# Patient Record
Sex: Female | Born: 1980
Health system: Southern US, Community
[De-identification: ages and names within clinical notes are randomized; demographics above are authoritative.]

## PROBLEM LIST (undated history)

## (undated) DIAGNOSIS — N809 Endometriosis, unspecified: Secondary | ICD-10-CM

## (undated) DIAGNOSIS — E079 Disorder of thyroid, unspecified: Secondary | ICD-10-CM

## (undated) DIAGNOSIS — F419 Anxiety disorder, unspecified: Secondary | ICD-10-CM

## (undated) DIAGNOSIS — K52839 Microscopic colitis, unspecified: Secondary | ICD-10-CM

## (undated) DIAGNOSIS — Z87442 Personal history of urinary calculi: Secondary | ICD-10-CM

## (undated) DIAGNOSIS — M3313 Other dermatomyositis without myopathy: Secondary | ICD-10-CM

## (undated) DIAGNOSIS — K52832 Lymphocytic colitis: Secondary | ICD-10-CM

## (undated) DIAGNOSIS — D649 Anemia, unspecified: Secondary | ICD-10-CM

## (undated) DIAGNOSIS — M3390 Dermatopolymyositis, unspecified, organ involvement unspecified: Secondary | ICD-10-CM

## (undated) DIAGNOSIS — E039 Hypothyroidism, unspecified: Secondary | ICD-10-CM

## (undated) HISTORY — DX: Other dermatomyositis without myopathy: M33.13

## (undated) HISTORY — DX: Endometriosis, unspecified: N80.9

## (undated) HISTORY — DX: Anxiety disorder, unspecified: F41.9

## (undated) HISTORY — PX: MUSCLE BIOPSY: SHX716

## (undated) HISTORY — DX: Microscopic colitis, unspecified: K52.839

## (undated) HISTORY — DX: Anemia, unspecified: D64.9

## (undated) HISTORY — DX: Disorder of thyroid, unspecified: E07.9

## (undated) HISTORY — DX: Dermatopolymyositis, unspecified, organ involvement unspecified: M33.90

## (undated) HISTORY — DX: Lymphocytic colitis: K52.832

## (undated) HISTORY — DX: Hypothyroidism, unspecified: E03.9

---
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
## 2001-02-20 ENCOUNTER — Emergency Department (HOSPITAL_COMMUNITY): Admission: EM | Admit: 2001-02-20 | Discharge: 2001-02-20 | Payer: Self-pay

## 2001-05-28 ENCOUNTER — Encounter: Payer: Self-pay | Admitting: *Deleted

## 2001-05-28 ENCOUNTER — Encounter: Admission: RE | Admit: 2001-05-28 | Discharge: 2001-05-28 | Payer: Self-pay | Admitting: *Deleted

## 2001-06-04 ENCOUNTER — Other Ambulatory Visit: Admission: RE | Admit: 2001-06-04 | Discharge: 2001-06-04 | Payer: Self-pay | Admitting: *Deleted

## 2002-06-15 ENCOUNTER — Other Ambulatory Visit: Admission: RE | Admit: 2002-06-15 | Discharge: 2002-06-15 | Payer: Self-pay | Admitting: *Deleted

## 2003-03-22 ENCOUNTER — Emergency Department (HOSPITAL_COMMUNITY): Admission: EM | Admit: 2003-03-22 | Discharge: 2003-03-22 | Payer: Self-pay | Admitting: *Deleted

## 2003-06-16 ENCOUNTER — Other Ambulatory Visit: Admission: RE | Admit: 2003-06-16 | Discharge: 2003-06-16 | Payer: Self-pay | Admitting: *Deleted

## 2003-12-26 ENCOUNTER — Ambulatory Visit: Payer: Self-pay | Admitting: Family Medicine

## 2003-12-28 ENCOUNTER — Ambulatory Visit (HOSPITAL_COMMUNITY): Admission: RE | Admit: 2003-12-28 | Discharge: 2003-12-28 | Payer: Self-pay | Admitting: *Deleted

## 2004-01-25 ENCOUNTER — Ambulatory Visit: Payer: Self-pay | Admitting: Family Medicine

## 2004-06-18 ENCOUNTER — Other Ambulatory Visit: Admission: RE | Admit: 2004-06-18 | Discharge: 2004-06-18 | Payer: Self-pay | Admitting: *Deleted

## 2004-10-31 ENCOUNTER — Encounter (INDEPENDENT_AMBULATORY_CARE_PROVIDER_SITE_OTHER): Payer: Self-pay | Admitting: *Deleted

## 2004-10-31 ENCOUNTER — Ambulatory Visit (HOSPITAL_BASED_OUTPATIENT_CLINIC_OR_DEPARTMENT_OTHER): Admission: RE | Admit: 2004-10-31 | Discharge: 2004-10-31 | Payer: Self-pay | Admitting: Plastic Surgery

## 2004-12-11 ENCOUNTER — Ambulatory Visit: Payer: Self-pay | Admitting: Family Medicine

## 2005-01-14 ENCOUNTER — Emergency Department (HOSPITAL_COMMUNITY): Admission: EM | Admit: 2005-01-14 | Discharge: 2005-01-14 | Payer: Self-pay | Admitting: *Deleted

## 2005-07-02 ENCOUNTER — Other Ambulatory Visit: Admission: RE | Admit: 2005-07-02 | Discharge: 2005-07-02 | Payer: Self-pay | Admitting: *Deleted

## 2006-06-23 ENCOUNTER — Other Ambulatory Visit: Admission: RE | Admit: 2006-06-23 | Discharge: 2006-06-23 | Payer: Self-pay | Admitting: *Deleted

## 2006-06-25 ENCOUNTER — Encounter: Admission: RE | Admit: 2006-06-25 | Discharge: 2006-06-25 | Payer: Self-pay | Admitting: *Deleted

## 2006-07-22 ENCOUNTER — Ambulatory Visit (HOSPITAL_COMMUNITY): Admission: RE | Admit: 2006-07-22 | Discharge: 2006-07-22 | Payer: Self-pay | Admitting: *Deleted

## 2007-09-11 ENCOUNTER — Encounter: Admission: RE | Admit: 2007-09-11 | Discharge: 2007-09-11 | Payer: Self-pay | Admitting: Obstetrics and Gynecology

## 2008-02-06 ENCOUNTER — Emergency Department (HOSPITAL_COMMUNITY): Admission: EM | Admit: 2008-02-06 | Discharge: 2008-02-06 | Payer: Self-pay | Admitting: Family Medicine

## 2009-02-04 HISTORY — PX: OTHER SURGICAL HISTORY: SHX169

## 2009-03-03 ENCOUNTER — Inpatient Hospital Stay (HOSPITAL_COMMUNITY): Admission: AD | Admit: 2009-03-03 | Discharge: 2009-03-04 | Payer: Self-pay | Admitting: Obstetrics & Gynecology

## 2009-03-04 ENCOUNTER — Inpatient Hospital Stay (HOSPITAL_COMMUNITY): Admission: AD | Admit: 2009-03-04 | Discharge: 2009-03-04 | Payer: Self-pay | Admitting: Obstetrics & Gynecology

## 2009-03-17 ENCOUNTER — Inpatient Hospital Stay (HOSPITAL_COMMUNITY): Admission: AD | Admit: 2009-03-17 | Discharge: 2009-03-17 | Payer: Self-pay | Admitting: Obstetrics & Gynecology

## 2009-05-17 ENCOUNTER — Inpatient Hospital Stay (HOSPITAL_COMMUNITY): Admission: AD | Admit: 2009-05-17 | Discharge: 2009-05-17 | Payer: Self-pay | Admitting: Obstetrics and Gynecology

## 2009-05-18 ENCOUNTER — Inpatient Hospital Stay (HOSPITAL_COMMUNITY): Admission: AD | Admit: 2009-05-18 | Discharge: 2009-06-28 | Payer: Self-pay | Admitting: Obstetrics and Gynecology

## 2009-05-31 ENCOUNTER — Encounter: Payer: Self-pay | Admitting: Obstetrics and Gynecology

## 2009-06-09 ENCOUNTER — Encounter: Payer: Self-pay | Admitting: Obstetrics and Gynecology

## 2009-06-15 ENCOUNTER — Encounter: Payer: Self-pay | Admitting: Obstetrics and Gynecology

## 2009-06-22 ENCOUNTER — Encounter: Payer: Self-pay | Admitting: Obstetrics and Gynecology

## 2010-02-25 ENCOUNTER — Encounter: Payer: Self-pay | Admitting: Obstetrics and Gynecology

## 2010-04-23 LAB — CBC
HCT: 28 % — ABNORMAL LOW (ref 36.0–46.0)
Hemoglobin: 10.2 g/dL — ABNORMAL LOW (ref 12.0–15.0)
Hemoglobin: 10 g/dL — ABNORMAL LOW (ref 12.0–15.0)
MCHC: 35.2 g/dL (ref 30.0–36.0)
MCHC: 35.6 g/dL (ref 30.0–36.0)
MCV: 97.3 fL (ref 78.0–100.0)
MCV: 98.7 fL (ref 78.0–100.0)
Platelets: 172 10*3/uL (ref 150–400)
Platelets: 158 10*3/uL (ref 150–400)
RDW: 12.2 % (ref 11.5–15.5)
WBC: 9.5 10*3/uL (ref 4.0–10.5)
WBC: 11.1 10*3/uL — ABNORMAL HIGH (ref 4.0–10.5)
WBC: 13.4 10*3/uL — ABNORMAL HIGH (ref 4.0–10.5)

## 2010-04-23 LAB — ABO/RH: ABO/RH(D): O POS

## 2010-04-24 LAB — CBC
HCT: 27.9 % — ABNORMAL LOW (ref 36.0–46.0)
MCV: 99.7 fL (ref 78.0–100.0)
RDW: 11.9 % (ref 11.5–15.5)
WBC: 17.2 10*3/uL — ABNORMAL HIGH (ref 4.0–10.5)

## 2010-04-24 LAB — GLUCOSE TOLERANCE, 1 HOUR: Glucose, 1 Hour GTT: 96 mg/dL (ref 70–140)

## 2010-04-24 LAB — URINE CULTURE: Special Requests: NEGATIVE

## 2010-04-24 LAB — TSH: TSH: 1.452 u[IU]/mL (ref 0.350–4.500)

## 2010-04-24 LAB — STREP B DNA PROBE

## 2010-04-24 LAB — MAGNESIUM: Magnesium: 6.3 mg/dL (ref 1.5–2.5)

## 2010-04-25 LAB — URINALYSIS, ROUTINE W REFLEX MICROSCOPIC
Bilirubin Urine: NEGATIVE
Glucose, UA: NEGATIVE mg/dL
Hgb urine dipstick: NEGATIVE
Ketones, ur: NEGATIVE mg/dL
pH: 7 (ref 5.0–8.0)

## 2010-04-26 LAB — URINALYSIS, ROUTINE W REFLEX MICROSCOPIC
Bilirubin Urine: NEGATIVE
Nitrite: NEGATIVE
Specific Gravity, Urine: 1.025 (ref 1.005–1.030)
pH: 7 (ref 5.0–8.0)

## 2010-04-26 LAB — URINE MICROSCOPIC-ADD ON

## 2010-04-26 LAB — CBC
Hemoglobin: 10.6 g/dL — ABNORMAL LOW (ref 12.0–15.0)
Platelets: 172 10*3/uL (ref 150–400)
RDW: 12.4 % (ref 11.5–15.5)

## 2010-05-21 LAB — POCT URINALYSIS DIP (DEVICE)
Glucose, UA: NEGATIVE mg/dL
Nitrite: NEGATIVE

## 2010-06-04 ENCOUNTER — Other Ambulatory Visit (INDEPENDENT_AMBULATORY_CARE_PROVIDER_SITE_OTHER): Payer: Self-pay | Admitting: *Deleted

## 2010-06-04 DIAGNOSIS — E039 Hypothyroidism, unspecified: Secondary | ICD-10-CM

## 2010-06-04 LAB — TSH: TSH: 5.07 u[IU]/mL (ref 0.35–5.50)

## 2011-01-07 IMAGING — US US OB COMP +14 WK
1 series · 14 of 28 positions shown · non-contrast
Comparison: none

OBSTETRICAL ULTRASOUND:
 This ultrasound was performed in The [HOSPITAL], and the [facility] report will be stored to [REDACTED] PACS.  This report is also available in [HOSPITAL]?s accessANYware.

[Series 1: us ob comp +14 wk · 0.21mm/px · 14 of 82 slices shown]
[im 4/82]
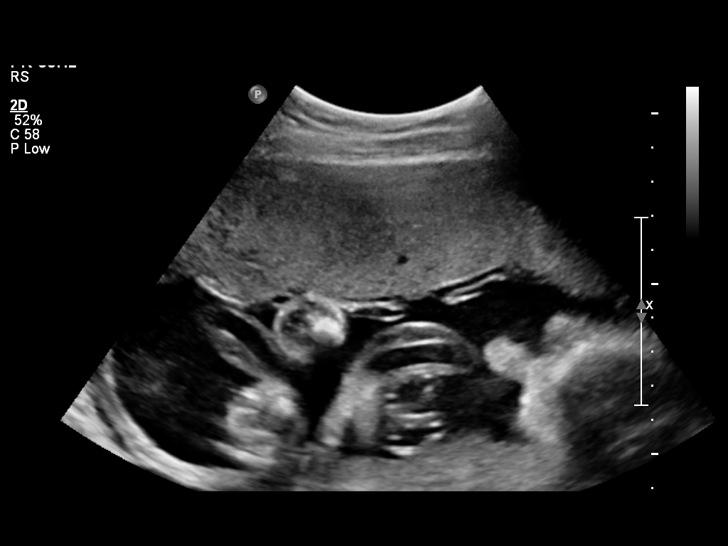
[im 10/82]
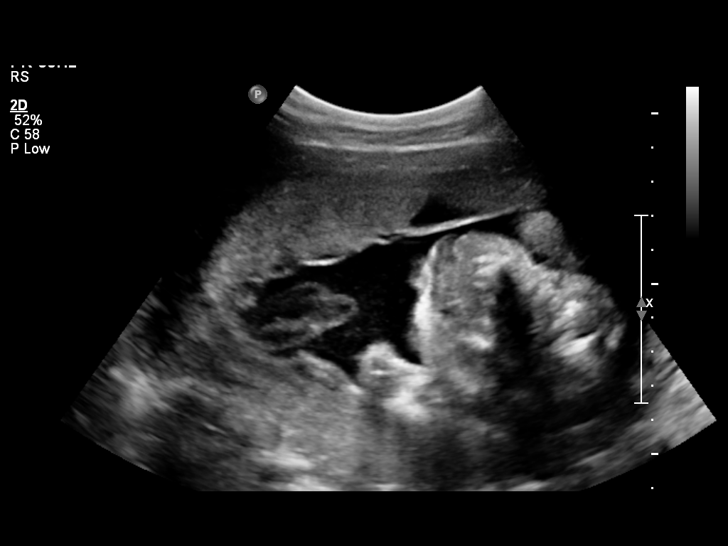
[im 16/82]
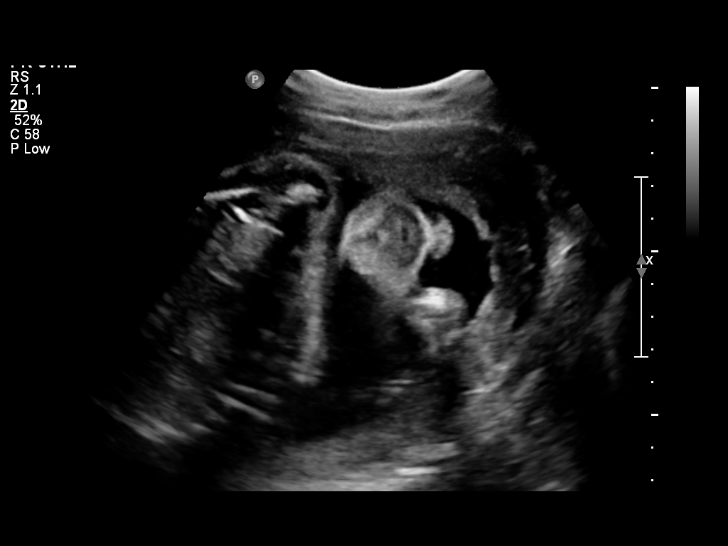
[im 22/82]
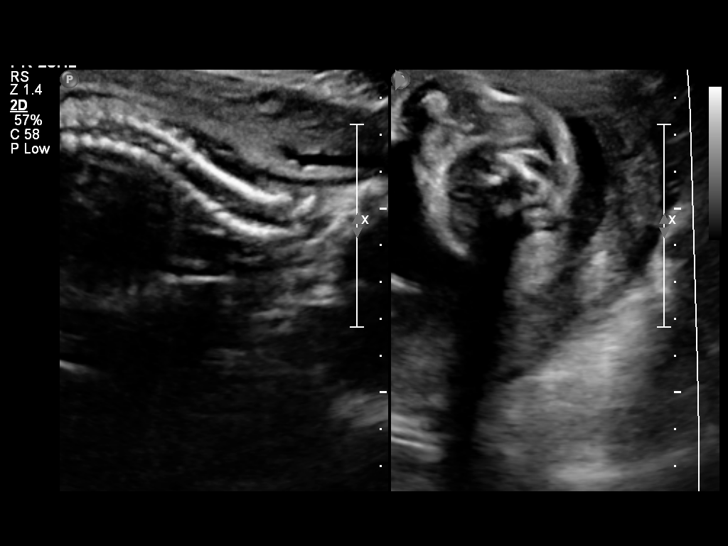
[im 28/82]
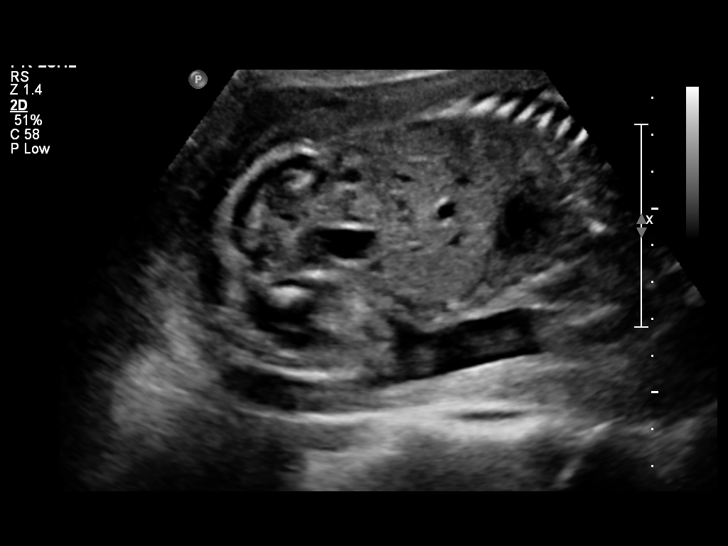
[im 34/82]
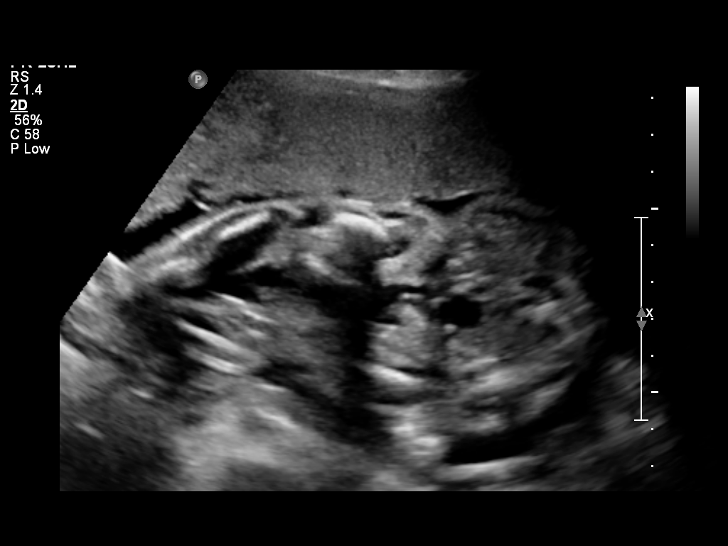
[im 40/82]
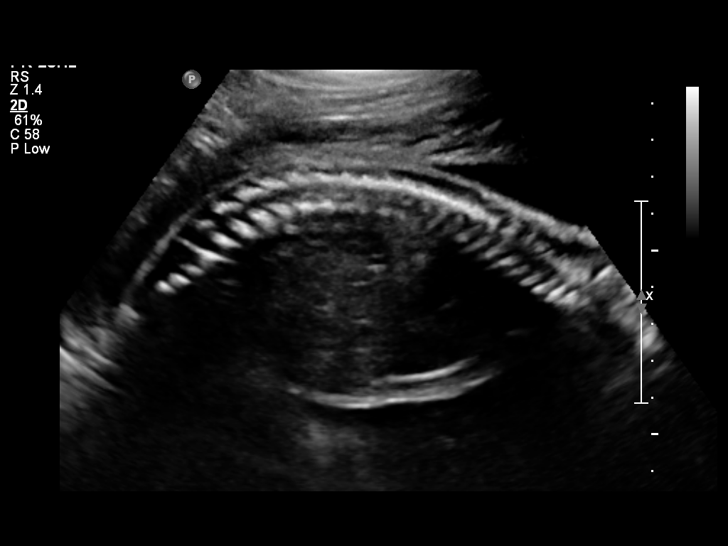
[im 46/82]
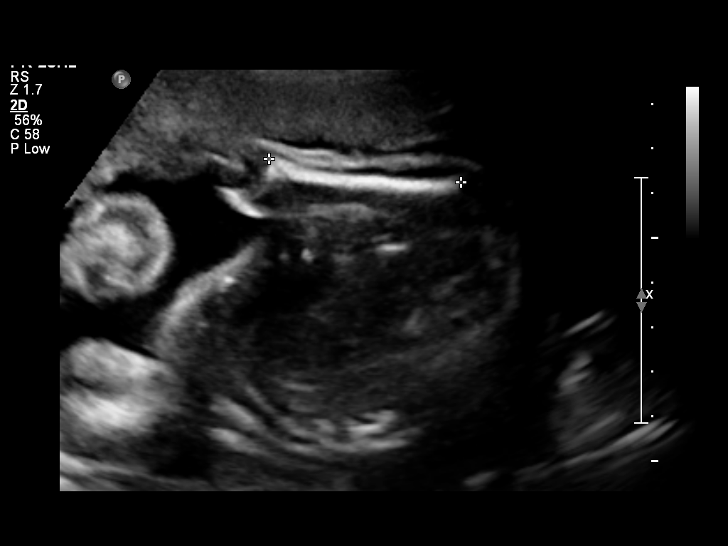
[im 52/82]
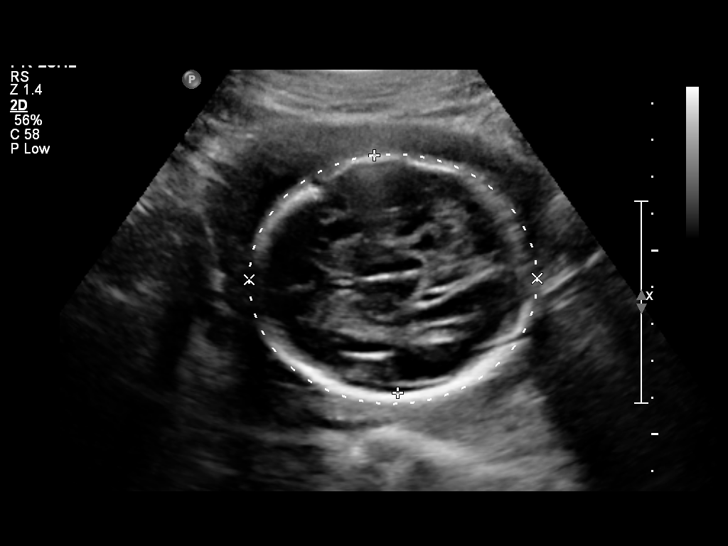
[im 58/82]
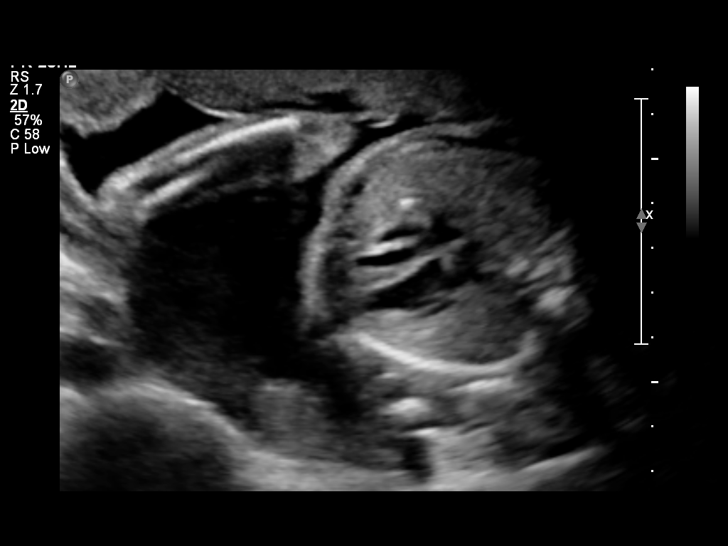
[im 64/82]
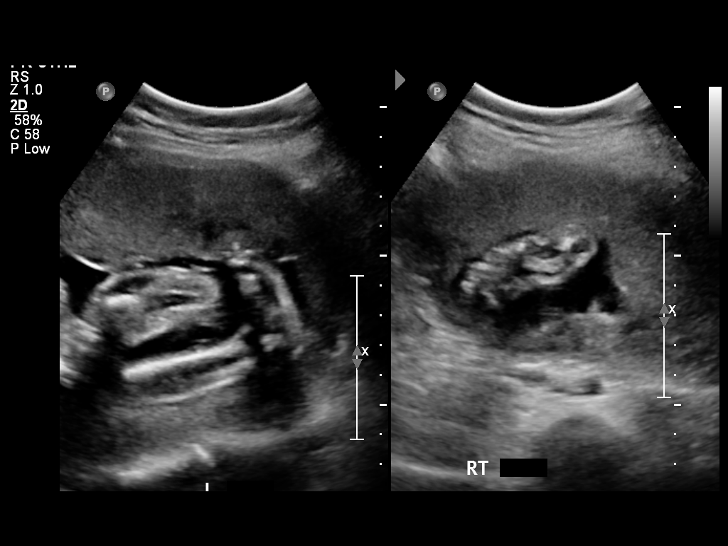
[im 70/82]
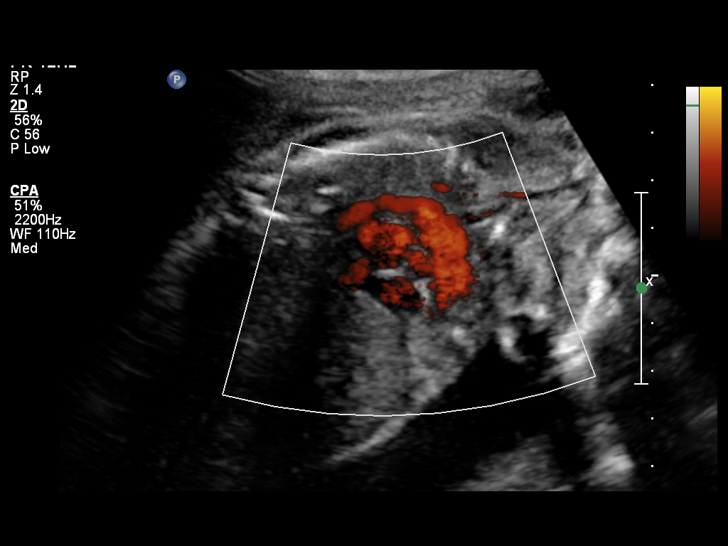
[im 76/82]
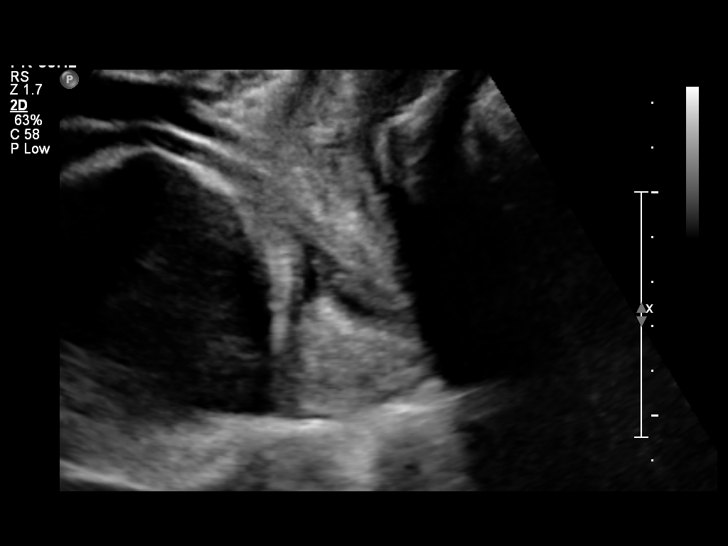
[im 82/82]
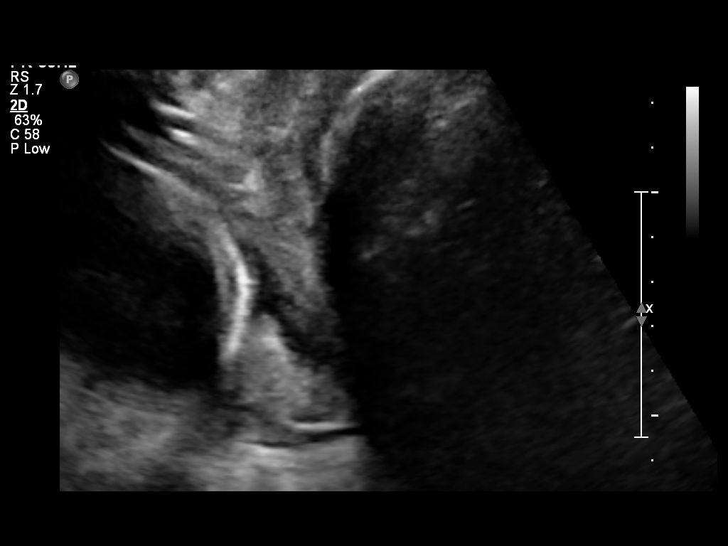

[14 of 28 positions shown; findings below may reference images not displayed]

IMPRESSION: [facility] has also been faxed to the ordering physician.

## 2011-01-08 ENCOUNTER — Other Ambulatory Visit: Payer: Self-pay | Admitting: Internal Medicine

## 2011-01-08 DIAGNOSIS — E041 Nontoxic single thyroid nodule: Secondary | ICD-10-CM

## 2011-01-10 ENCOUNTER — Other Ambulatory Visit: Payer: Self-pay | Admitting: Internal Medicine

## 2011-01-10 ENCOUNTER — Ambulatory Visit
Admission: RE | Admit: 2011-01-10 | Discharge: 2011-01-10 | Disposition: A | Discharge status: Home / Self Care | Payer: 59 | Source: Ambulatory Visit | Attending: Internal Medicine | Admitting: Internal Medicine

## 2011-01-10 DIAGNOSIS — E041 Nontoxic single thyroid nodule: Secondary | ICD-10-CM

## 2011-01-29 IMAGING — US US OB TRANSVAGINAL MODIFY
1 series · 18 of 28 positions shown · non-contrast
Comparison: none

OBSTETRICAL ULTRASOUND:
 This ultrasound was performed in The [HOSPITAL], and the [facility] report will be stored to [REDACTED] PACS.  This report is also available in [HOSPITAL]?s accessANYware.

[Series 1: us ob transvaginal modify · 18 of 30 slices shown]
[im 1/30]
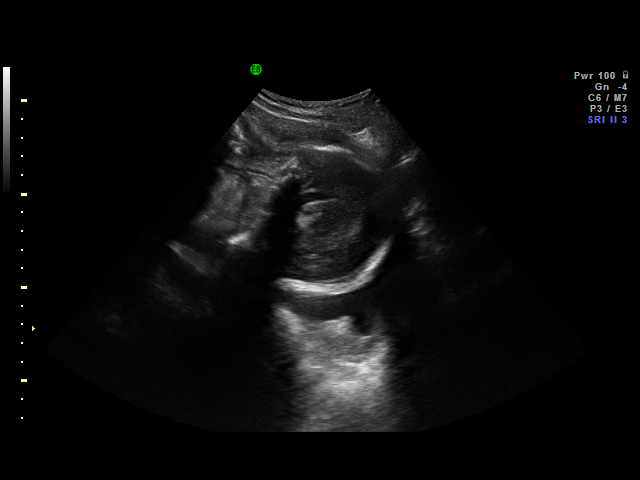
[im 3/30]
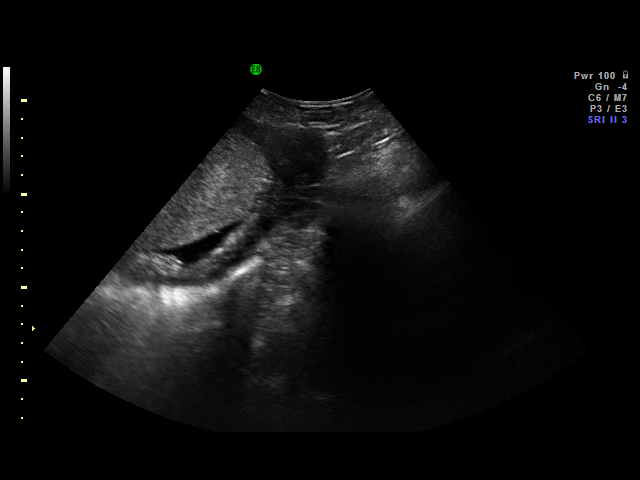
[im 4/30]
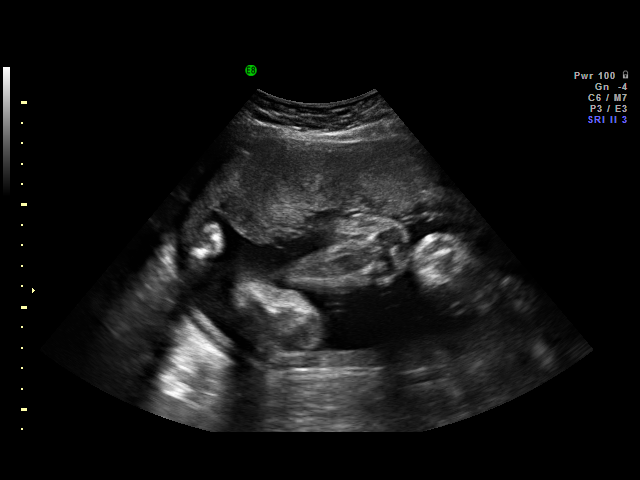
[im 6/30]
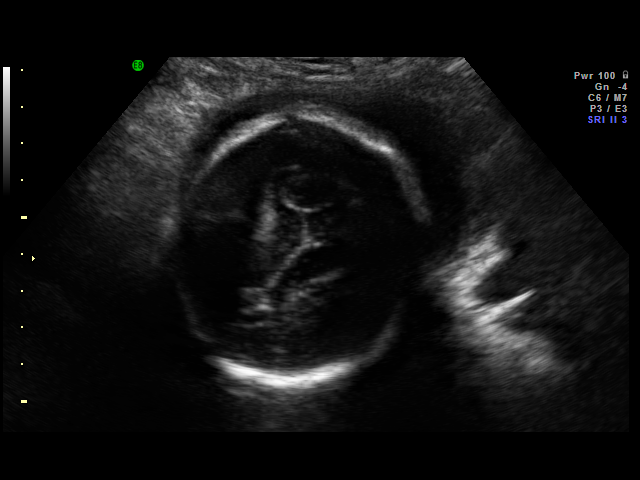
[im 8/30]
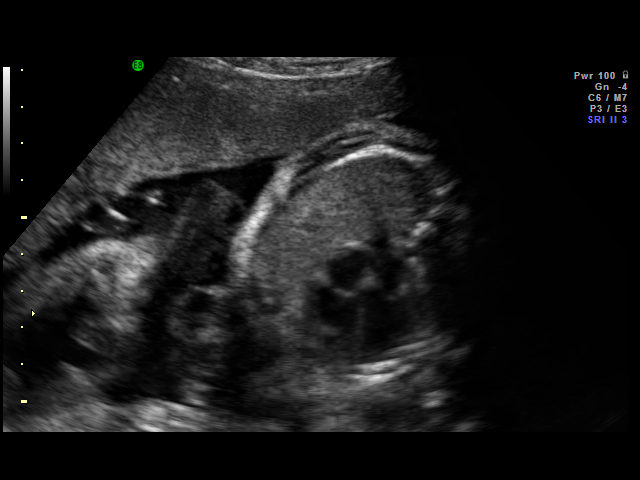
[im 9/30]
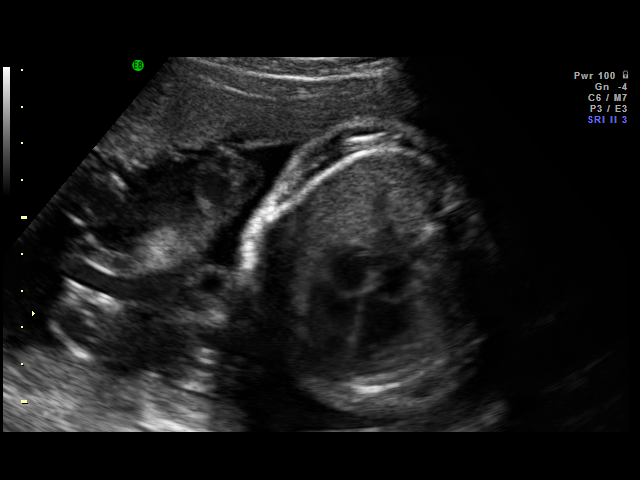
[im 11/30]
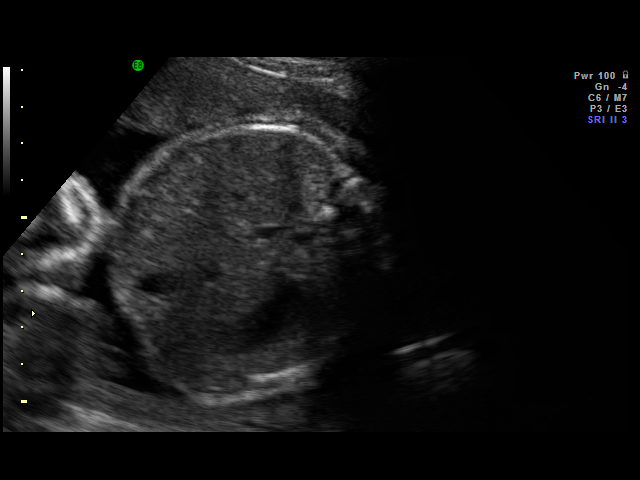
[im 12/30]
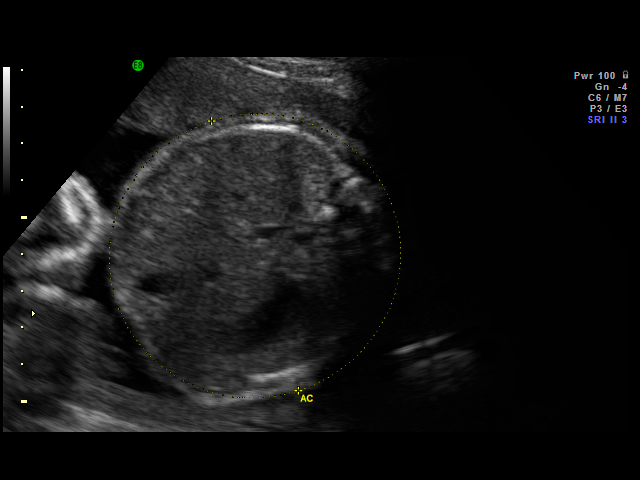
[im 14/30]
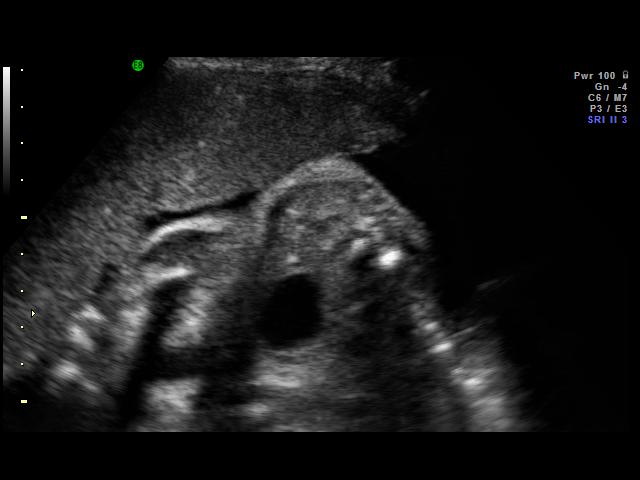
[im 16/30]
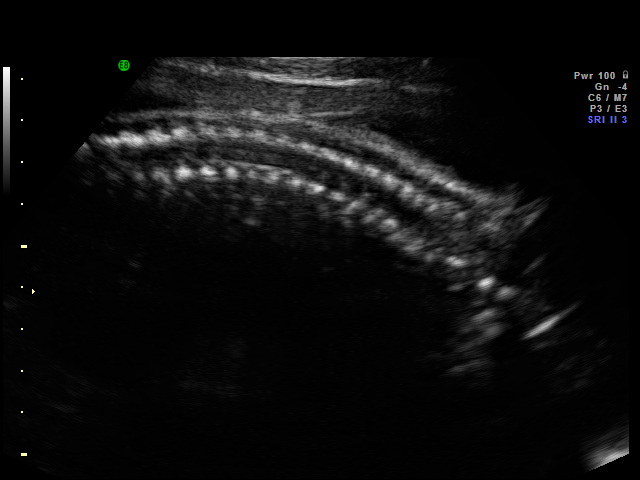
[im 18/30]
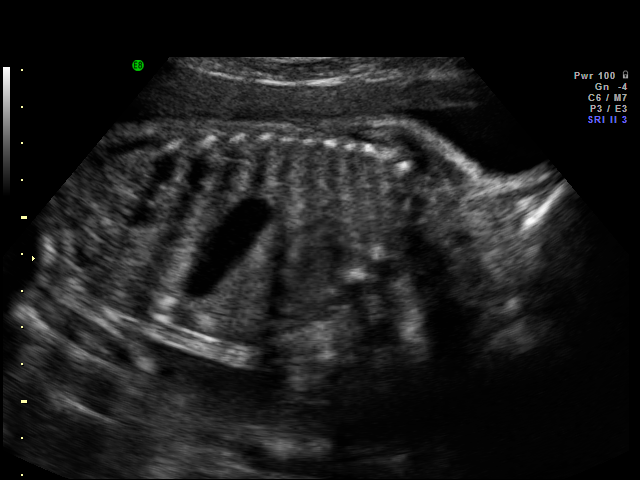
[im 19/30]
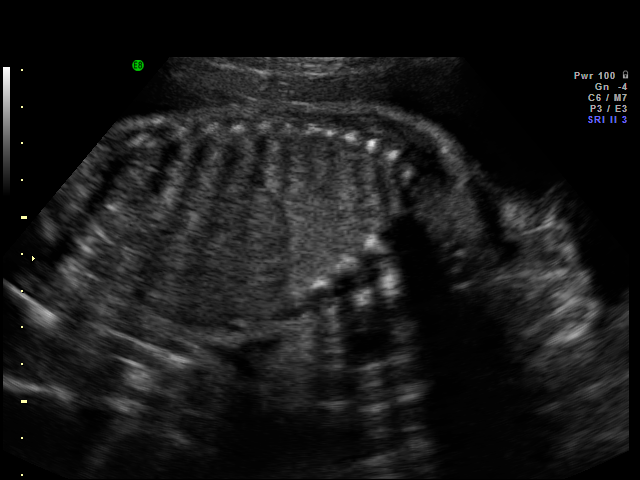
[im 21/30]
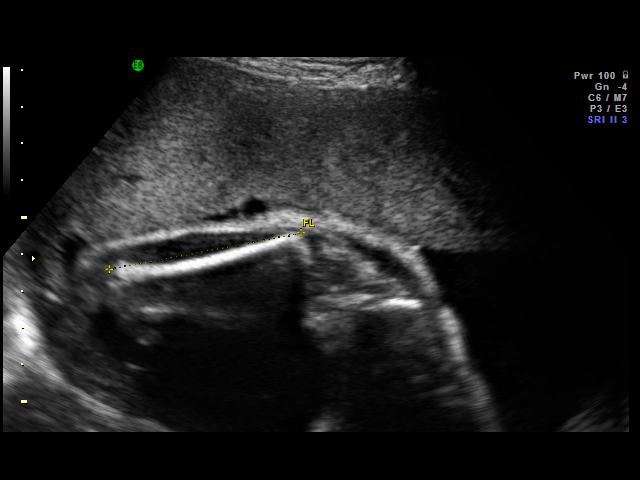
[im 23/30]
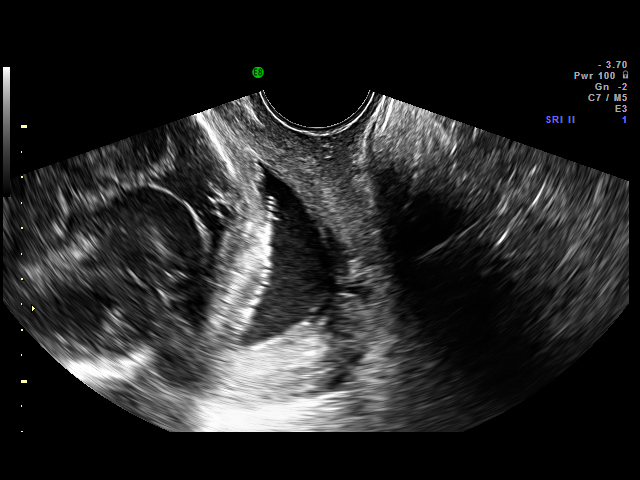
[im 24/30]
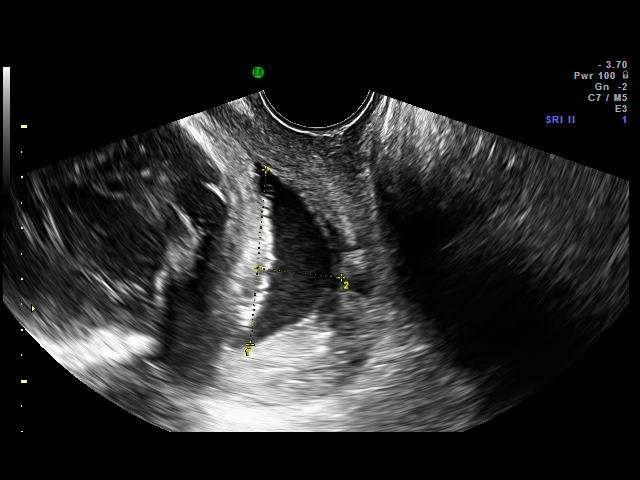
[im 26/30]
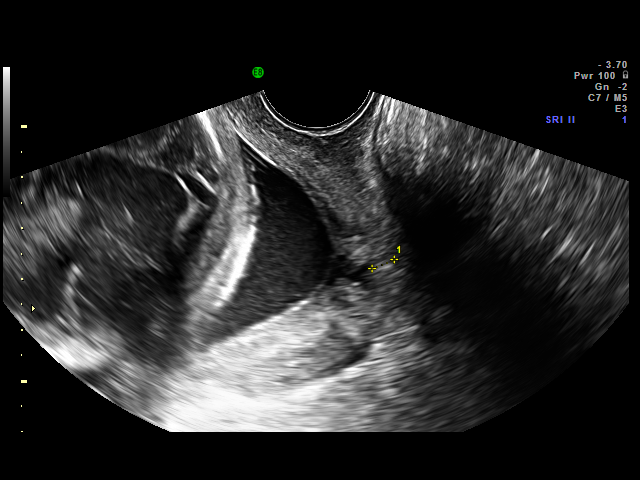
[im 27/30]
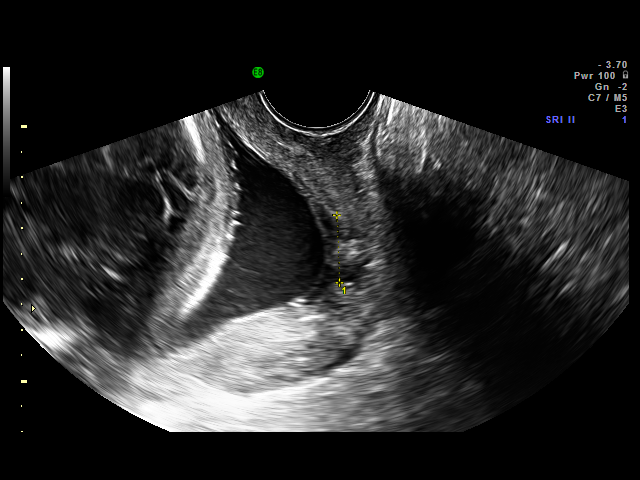
[im 30/30]
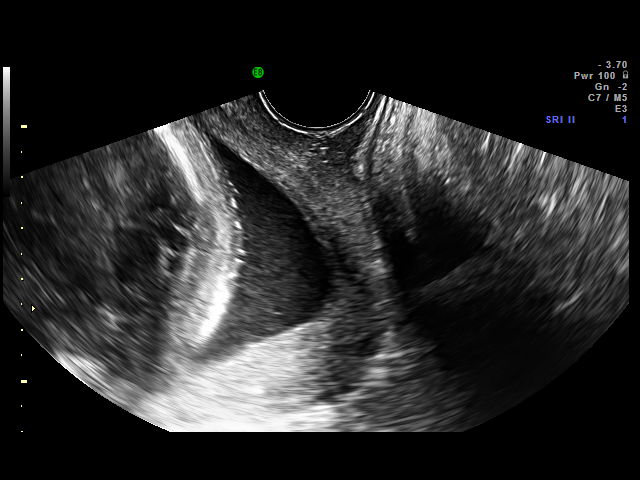

[18 of 28 positions shown; findings below may reference images not displayed]

IMPRESSION: [facility] has also been faxed to the ordering physician.

## 2011-07-17 ENCOUNTER — Encounter: Payer: Self-pay | Admitting: *Deleted

## 2012-11-02 ENCOUNTER — Encounter: Payer: Self-pay | Admitting: Gastroenterology

## 2012-12-02 ENCOUNTER — Other Ambulatory Visit (INDEPENDENT_AMBULATORY_CARE_PROVIDER_SITE_OTHER): Payer: 59

## 2012-12-02 ENCOUNTER — Ambulatory Visit (INDEPENDENT_AMBULATORY_CARE_PROVIDER_SITE_OTHER): Payer: 59 | Admitting: Gastroenterology

## 2012-12-02 ENCOUNTER — Encounter: Payer: Self-pay | Admitting: Gastroenterology

## 2012-12-02 VITALS — BP 100/60 | HR 72 | Ht 65.0 in | Wt 144.2 lb

## 2012-12-02 DIAGNOSIS — R197 Diarrhea, unspecified: Secondary | ICD-10-CM

## 2012-12-02 DIAGNOSIS — Z8639 Personal history of other endocrine, nutritional and metabolic disease: Secondary | ICD-10-CM | POA: Insufficient documentation

## 2012-12-02 LAB — TSH: TSH: 2.63 u[IU]/mL (ref 0.35–5.50)

## 2012-12-02 NOTE — Patient Instructions (Signed)
You will have labs checked today in the basement lab.  Please head down after you check out with the front desk  (TSH, c. Diff by PCR, stool for ova/parasites, stool for fecal leuks, stool for routine culture, cmet, celiac panel, esr). Pending those results you may need colonoscopy.

## 2012-12-02 NOTE — Progress Notes (Signed)
HPI: This is a  very pleasant 32 year old woman whom I am meeting for the first time today.  Colonoscopy 15 years ago, [facility].  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  Diarrhea for 3 months, 6-7 times per day.  No nocturnal.  Post prandial. No bleeding. Epigastric discomfort for 2 months.  Had colitis in her teens.  [facility].  Was put on imodium, no specific IBD type treatment.  Takes imodium 2 pills per day, works pretty well.  Overall weight stable.  TSH checked 2 months ago.  She does not drink a lot of caffeine, she does not drink much alcohol, she is not into diet beverages or sugar substitutes or sports drinks.  Had hashimotos.  Did brat diet for a while, no changes.  No preceeding antibiotics.   Review of systems: Pertinent positive and negative review of systems were noted in the above HPI section. Complete review of systems was performed and was otherwise normal.    Past Medical History  Diagnosis Date  . Anemia   . Thyroid disease     Past Surgical History  Procedure Laterality Date  . Laproscopy for endometrosis      Current Outpatient Prescriptions  Medication Sig Dispense Refill  . levothyroxine (SYNTHROID, LEVOTHROID) 75 MCG tablet Take 75 mcg by mouth daily before breakfast.      . sertraline (ZOLOFT) 100 MG tablet Take 100 mg by mouth daily.       No current facility-administered medications for this visit.    Allergies as of 12/02/2012 - Review Complete 12/02/2012  Allergen Reaction Noted  . Ciprocinonide [fluocinolone] Rash 12/02/2012  . Penicillins Rash 12/02/2012    Family History  Problem Relation Age of Onset  . Hypertension Father   . Hyperlipidemia Father     History   Social History  . Marital Status: Married    Spouse Name: N/A    Number of Children: 1  . Years of Education: N/A   Occupational History  . RN [employer]   Social History Main Topics  . Smoking status: Never Smoker   . Smokeless tobacco: Never Used  . Alcohol Use: No   . Drug Use: No  . Sexual Activity: Not on file   Other Topics Concern  . Not on file   Social History Narrative  . No narrative on file       Physical Exam: BP 100/60  Pulse 72  Ht 5\' 5"  (1.651 m)  Wt 144 lb 4 oz (65.431 kg)  BMI 24 kg/m2  LMP 11/28/2012 Constitutional: generally well-appearing Psychiatric: alert and oriented x3 Eyes: extraocular movements intact Mouth: oral pharynx moist, no lesions Neck: supple no lymphadenopathy Cardiovascular: heart regular rate and rhythm Lungs: clear to auscultation bilaterally Abdomen: soft, nontender, nondistended, no obvious ascites, no peritoneal signs, normal bowel sounds Extremities: no lower extremity edema bilaterally Skin: no lesions on visible extremities    Assessment and plan: 32 y.o. female with  nonbloody diarrhea, mild abdominal discomfort for 3 months.  She has a history of colitis 15 years ago. She is not exactly clear what type of colitis it was. Perhaps infectious. She does not recall being on any specific IBD-type medicines. Going to have testing for celiac sprue, infectious process with stool testing as well, sedimentation rate, recheck her thyroid level. Pending those results she may need colonoscopy as well. In the meantime Imodium helps her and she'll stay on that as needed.

## 2012-12-03 ENCOUNTER — Other Ambulatory Visit: Payer: 59

## 2012-12-03 DIAGNOSIS — R197 Diarrhea, unspecified: Secondary | ICD-10-CM

## 2012-12-03 LAB — CELIAC PANEL 10
Endomysial Screen: NEGATIVE
Gliadin IgA: 3.1 U/mL (ref <20)
Gliadin IgG: 4.4 U/mL (ref <20)
IgA: 93 mg/dL (ref 69–380)
Tissue Transglut Ab: 4.2 U/mL (ref <20)
Tissue Transglutaminase Ab, IgA: 2.5 U/mL (ref <20)

## 2012-12-04 ENCOUNTER — Other Ambulatory Visit: Payer: Self-pay | Admitting: Gastroenterology

## 2012-12-04 LAB — OVA AND PARASITE SCREEN: OP: NONE SEEN

## 2012-12-04 LAB — CLOSTRIDIUM DIFFICILE BY PCR: Toxigenic C. Difficile by PCR: NOT DETECTED

## 2012-12-05 LAB — FECAL LACTOFERRIN, QUANT: Lactoferrin: NEGATIVE

## 2012-12-07 LAB — STOOL CULTURE

## 2012-12-10 ENCOUNTER — Ambulatory Visit (AMBULATORY_SURGERY_CENTER): Payer: Self-pay | Admitting: *Deleted

## 2012-12-10 VITALS — Ht 65.0 in | Wt 145.4 lb

## 2012-12-10 DIAGNOSIS — R197 Diarrhea, unspecified: Secondary | ICD-10-CM

## 2012-12-10 MED ORDER — MOVIPREP 100 G PO SOLR: ORAL | Status: DC

## 2012-12-10 NOTE — Progress Notes (Signed)
No allergies to eggs or soy. No problems with anesthesia.  

## 2012-12-11 ENCOUNTER — Encounter: Payer: Self-pay | Admitting: Gastroenterology

## 2012-12-14 ENCOUNTER — Encounter: Payer: Self-pay | Admitting: Gastroenterology

## 2012-12-14 ENCOUNTER — Ambulatory Visit (AMBULATORY_SURGERY_CENTER): Payer: 59 | Admitting: Gastroenterology

## 2012-12-14 VITALS — BP 105/66 | HR 60 | Temp 98.4°F | Resp 17 | Ht 65.0 in | Wt 145.0 lb

## 2012-12-14 DIAGNOSIS — K5289 Other specified noninfective gastroenteritis and colitis: Secondary | ICD-10-CM

## 2012-12-14 DIAGNOSIS — R197 Diarrhea, unspecified: Secondary | ICD-10-CM

## 2012-12-14 MED ORDER — SODIUM CHLORIDE 0.9 % IV SOLN: 500.0000 mL | INTRAVENOUS | Status: DC

## 2012-12-14 NOTE — Progress Notes (Signed)
Patient did not experience any of the following events: a burn prior to discharge; a fall within the facility; wrong site/side/patient/procedure/implant event; or a hospital transfer or hospital admission upon discharge from the facility. (G8907) Patient did not have preoperative order for IV antibiotic SSI prophylaxis. (G8918)  

## 2012-12-14 NOTE — Progress Notes (Signed)
The pt was nervous pre-procedure.  Sedation titrated.  Pt was drowsy but opened her eyes off and on through the exam.  She was comfortable after the cecum was reached and while the scope was advance.  Pt tolerated the colonoscopy very well. Maw

## 2012-12-14 NOTE — Patient Instructions (Signed)
Discharge instructions given with verbal understanding. Biopsies taken. Resume previous medications,. YOU HAD AN ENDOSCOPIC PROCEDURE TODAY AT THE [facility]: Refer to the procedure report that was given to you for any specific questions about what was found during the examination.  If the procedure report does not answer your questions, please call your gastroenterologist to clarify.  If you requested that your care partner not be given the details of your procedure findings, then the procedure report has been included in a sealed envelope for you to review at your convenience later.  YOU SHOULD EXPECT: Some feelings of bloating in the abdomen. Passage of more gas than usual.  Walking can help get rid of the air that was put into your GI tract during the procedure and reduce the bloating. If you had a lower endoscopy (such as a colonoscopy or flexible sigmoidoscopy) you may notice spotting of blood in your stool or on the toilet paper. If you underwent a bowel prep for your procedure, then you may not have a normal bowel movement for a few days.  DIET: Your first meal following the procedure should be a light meal and then it is ok to progress to your normal diet.  A half-sandwich or bowl of soup is an example of a good first meal.  Heavy or fried foods are harder to digest and may make you feel nauseous or bloated.  Likewise meals heavy in dairy and vegetables can cause extra gas to form and this can also increase the bloating.  Drink plenty of fluids but you should avoid alcoholic beverages for 24 hours.  ACTIVITY: Your care partner should take you home directly after the procedure.  You should plan to take it easy, moving slowly for the rest of the day.  You can resume normal activity the day after the procedure however you should NOT DRIVE or use heavy machinery for 24 hours (because of the sedation medicines used during the test).    SYMPTOMS TO REPORT IMMEDIATELY: A  gastroenterologist can be reached at any hour.  During normal business hours, 8:30 AM to 5:00 PM Monday through Friday, call [phone].  After hours and on weekends, please call the GI answering service at [phone] who will take a message and have the physician on call contact you.   Following lower endoscopy (colonoscopy or flexible sigmoidoscopy):  Excessive amounts of blood in the stool  Significant tenderness or worsening of abdominal pains  Swelling of the abdomen that is new, acute  Fever of 100F or higher  FOLLOW UP: If any biopsies were taken you will be contacted by phone or by letter within the next 1-3 weeks.  Call your gastroenterologist if you have not heard about the biopsies in 3 weeks.  Our staff will call the home number listed on your records the next business day following your procedure to check on you and address any questions or concerns that you may have at that time regarding the information given to you following your procedure. This is a courtesy call and so if there is no answer at the home number and we have not heard from you through the emergency physician on call, we will assume that you have returned to your regular daily activities without incident.  SIGNATURES/CONFIDENTIALITY: You and/or your care partner have signed paperwork which will be entered into your electronic medical record.  These signatures attest to the fact that that the information above on your After Visit Summary has been reviewed  and is understood.  Full responsibility of the confidentiality of this discharge information lies with you and/or your care-partner. 

## 2012-12-14 NOTE — Op Note (Signed)
[facility] [address] [place], [address]   COLONOSCOPY PROCEDURE REPORT  PATIENT: Annette Harris, Annette Harris  MR#: [id number] BIRTHDATE: 08-Aug-1980 , 32  yrs. old GENDER: Female ENDOSCOPIST: Rachael Fee, MD REFERRED AO:[id number] Cyndie Mull, M.D. PROCEDURE DATE:  12/14/2012 PROCEDURE:   Colonoscopy with hot biopsy/bipolar First Screening Colonoscopy - Avg.  risk and is 50 yrs.  old or older - No.  Prior Negative Screening - Now for repeat screening. N/A  History of Adenoma - Now for follow-up colonoscopy & has been > or = to 3 yrs.  N/A  Polyps Removed Today? No.  Recommend repeat exam, <10 yrs? No. ASA CLASS:   Class I INDICATIONS:chronic diarrhea. MEDICATIONS: Fentanyl 75 mcg IV, Versed 6 mg IV, and These medications were titrated to patient response per physician's verbal order  DESCRIPTION OF PROCEDURE:   After the risks benefits and alternatives of the procedure were thoroughly explained, informed consent was obtained.  A digital rectal exam revealed no abnormalities of the rectum.   The LB [id number]  endoscope was introduced through the anus and advanced to the terminal ileum which was intubated for a short distance. No adverse events experienced.   The quality of the prep was excellent.  The instrument was then slowly withdrawn as the colon was fully examined.   COLON FINDINGS: The terminal ileum was normal.  The colonic mucosa was normal throughout and was randomly biopsied, sent to pathology. Retroflexed views revealed no abnormalities. The time to cecum=1 minutes 58 seconds.  Withdrawal time=5 minutes 34 seconds.  The scope was withdrawn and the procedure completed. COMPLICATIONS: There were no complications.  ENDOSCOPIC IMPRESSION: The terminal ileum was normal.  The colonic mucosa was normal throughout and was randomly biopsied, sent to pathology.  RECOMMENDATIONS: Await final pathology results.  For now, continue imodium 1-2 pills every  day.   eSigned:  Rachael Fee, MD 12/14/2012 1:32 PM

## 2012-12-15 ENCOUNTER — Telehealth: Payer: Self-pay | Admitting: *Deleted

## 2012-12-15 NOTE — Telephone Encounter (Signed)
  Follow up Call-  Call back number 12/14/2012  Post procedure Call Back phone  # [phone]  Permission to leave phone message Yes     Patient questions:  Do you have a fever, pain , or abdominal swelling? no Pain Score  0 *  Have you tolerated food without any problems? yes  Have you been able to return to your normal activities? yes  Do you have any questions about your discharge instructions: Diet   no Medications  no Follow up visit  no  Do you have questions or concerns about your Care? no  Actions: * If pain score is 4 or above: No action needed, pain <4.

## 2012-12-18 ENCOUNTER — Telehealth: Payer: Self-pay | Admitting: Gastroenterology

## 2012-12-18 ENCOUNTER — Other Ambulatory Visit: Payer: Self-pay

## 2012-12-18 MED ORDER — PREDNISONE 5 MG PO TABS: 5.0000 mg | ORAL_TABLET | Freq: Every day | ORAL | Status: DC

## 2012-12-18 MED ORDER — BUDESONIDE 3 MG PO CP24: 9.0000 mg | ORAL_CAPSULE | Freq: Every day | ORAL | Status: DC

## 2012-12-18 NOTE — Telephone Encounter (Signed)
Dr Christella Hartigan what else can she try?  There are no options through [facility].

## 2012-12-18 NOTE — Telephone Encounter (Signed)
Ok, lets call in prednisone 5mg  pills.  Disp 100 with 2 refills.  I want her to try one pill once daily for a week, if that clearly improves her symptoms then she should stay on it unitl rov with me.  If not, then she should take two pills once a day.

## 2012-12-18 NOTE — Telephone Encounter (Signed)
Pt has been notified and will try the prednisone

## 2013-01-07 ENCOUNTER — Telehealth: Payer: Self-pay | Admitting: Gastroenterology

## 2013-01-07 MED ORDER — PREDNISONE 5 MG PO TABS: 5.0000 mg | ORAL_TABLET | Freq: Two times a day (BID) | ORAL | Status: DC

## 2013-01-07 NOTE — Telephone Encounter (Signed)
Prescription has been sent as pt requested

## 2013-01-15 ENCOUNTER — Encounter: Payer: Self-pay | Admitting: Gastroenterology

## 2013-01-15 ENCOUNTER — Ambulatory Visit (INDEPENDENT_AMBULATORY_CARE_PROVIDER_SITE_OTHER): Payer: 59 | Admitting: Gastroenterology

## 2013-01-15 VITALS — BP 110/60 | HR 70 | Ht 64.0 in | Wt 146.0 lb

## 2013-01-15 DIAGNOSIS — K5289 Other specified noninfective gastroenteritis and colitis: Secondary | ICD-10-CM

## 2013-01-15 DIAGNOSIS — K52839 Microscopic colitis, unspecified: Secondary | ICD-10-CM | POA: Insufficient documentation

## 2013-01-15 NOTE — Patient Instructions (Signed)
Stay on 5mg  prednisone once daily for 6 weeks. Also start one imodium once daily. Call in 5 weeks to report on your symptoms. Discuss replacement for zoloft.

## 2013-01-15 NOTE — Progress Notes (Signed)
Review of pertinent gastrointestinal problems: 1. Lymphocytic colitis, she presented with chronic diarrhea. Underwent colonoscopy November 2014. This was normal to the terminal ileum. Random colon biopsies showed lymphocytic colitis.  She could not afford budesonide and so was started on prednisone 5mg  pill, once daily    HPI: This is a very pleasant 32 year old woman whom I last saw the time of a colonoscopy.   5mg  for a week, no better.  Then 10mg  for 2 weeks and 80% better. She weaned back down to 5 mg per day and she is about the same, 80% better.  She is overall feeling improved   Past Medical History  Diagnosis Date  . Anemia   . Thyroid disease     Past Surgical History  Procedure Laterality Date  . Laproscopy for endometrosis  2011  . Tonsillectomy and adenoidectomy  1986    Current Outpatient Prescriptions  Medication Sig Dispense Refill  . levothyroxine (SYNTHROID, LEVOTHROID) 75 MCG tablet Take 75 mcg by mouth daily before breakfast.      . predniSONE (DELTASONE) 5 MG tablet Take 1 tablet (5 mg total) by mouth 2 (two) times daily.  100 tablet  2  . Prenatal Vit-Fe Sulfate-FA (PRENATAL VITAMIN PO) Take by mouth daily.      . sertraline (ZOLOFT) 100 MG tablet Take 100 mg by mouth daily.       No current facility-administered medications for this visit.    Allergies as of 01/15/2013 - Review Complete 01/15/2013  Allergen Reaction Noted  . Ciprocinonide [fluocinolone] Rash 12/02/2012  . Penicillins Rash 12/02/2012    Family History  Problem Relation Age of Onset  . Hypertension Father   . Hyperlipidemia Father   . Colon cancer Neg Hx     History   Social History  . Marital Status: Married    Spouse Name: N/A    Number of Children: 1  . Years of Education: N/A   Occupational History  . RN [employer]   Social History Main Topics  . Smoking status: Never Smoker   . Smokeless tobacco: Never Used  . Alcohol Use: No  . Drug Use: No  . Sexual  Activity: Not on file   Other Topics Concern  . Not on file   Social History Narrative  . No narrative on file      Physical Exam: BP 110/60  Pulse 70  Ht 5\' 4"  (1.626 m)  Wt 146 lb (66.225 kg)  BMI 25.05 kg/m2 Constitutional: generally well-appearing Psychiatric: alert and oriented x3 Abdomen: soft, nontender, nondistended, no obvious ascites, no peritoneal signs, normal bowel sounds     Assessment and plan: 32 y.o. female with lymphocytic colitis  She will stay on 5 mg prednisone pill one pill once daily for the next 6 weeks. I asked her to restart a single Imodium one pill once daily as well. If she is back to normal or nearly back to normal in 5 or 6 weeks from now, I would have her go to every other day dosing of prednisone. She will call to report on her symptoms in 5-6 weeks.

## 2013-06-24 ENCOUNTER — Other Ambulatory Visit: Payer: Self-pay | Admitting: Internal Medicine

## 2013-06-24 DIAGNOSIS — R140 Abdominal distension (gaseous): Secondary | ICD-10-CM

## 2013-06-24 DIAGNOSIS — M3390 Dermatopolymyositis, unspecified, organ involvement unspecified: Secondary | ICD-10-CM

## 2013-06-24 DIAGNOSIS — M3313 Other dermatomyositis without myopathy: Secondary | ICD-10-CM

## 2013-06-25 ENCOUNTER — Ambulatory Visit
Admission: RE | Admit: 2013-06-25 | Discharge: 2013-06-25 | Disposition: A | Discharge status: Home / Self Care | Payer: 59 | Source: Ambulatory Visit | Attending: Internal Medicine | Admitting: Internal Medicine

## 2013-06-25 DIAGNOSIS — R140 Abdominal distension (gaseous): Secondary | ICD-10-CM

## 2013-06-25 DIAGNOSIS — M3390 Dermatopolymyositis, unspecified, organ involvement unspecified: Secondary | ICD-10-CM

## 2013-12-15 ENCOUNTER — Other Ambulatory Visit: Payer: Self-pay | Admitting: Obstetrics and Gynecology

## 2013-12-16 LAB — CYTOLOGY - PAP

## 2014-03-31 ENCOUNTER — Other Ambulatory Visit: Payer: Self-pay | Admitting: Dermatology

## 2014-03-31 DIAGNOSIS — M6281 Muscle weakness (generalized): Secondary | ICD-10-CM

## 2014-04-05 ENCOUNTER — Ambulatory Visit
Admission: RE | Admit: 2014-04-05 | Discharge: 2014-04-05 | Disposition: A | Discharge status: Home / Self Care | Payer: 59 | Source: Ambulatory Visit | Attending: Dermatology | Admitting: Dermatology

## 2014-04-05 DIAGNOSIS — M6281 Muscle weakness (generalized): Secondary | ICD-10-CM

## 2014-09-23 ENCOUNTER — Other Ambulatory Visit: Payer: 59

## 2014-09-23 ENCOUNTER — Encounter: Payer: Self-pay | Admitting: Gastroenterology

## 2014-09-23 ENCOUNTER — Ambulatory Visit (INDEPENDENT_AMBULATORY_CARE_PROVIDER_SITE_OTHER): Payer: 59 | Admitting: Gastroenterology

## 2014-09-23 VITALS — BP 96/64 | HR 72 | Ht 65.0 in | Wt 152.0 lb

## 2014-09-23 DIAGNOSIS — R197 Diarrhea, unspecified: Secondary | ICD-10-CM

## 2014-09-23 NOTE — Patient Instructions (Signed)
You will have labs checked today in the basement lab.  Please head down after you check out with the front desk  (celiac panel). Follow up as needed.

## 2014-09-23 NOTE — Progress Notes (Signed)
Review of pertinent gastrointestinal problems: 1. Lymphocytic colitis?, she presented with chronic diarrhea. Underwent colonoscopy November 2014. This was normal to the terminal ileum. Random colon biopsies showed lymphocytic colitis. She could not afford budesonide and so was started on prednisone 5mg  pill.  Increased to 10mg  daily for a short time, good response.  Had rash while on prednisone and eventually saw a dermatologist. She was told she had dermatomyositis. She was put on methotrexate for 8 months. This did not really help the rash but her diarrhea did improve somewhat.  Eventually saw a holistic provider and they recommend complete absence of gluten products and all of her symptoms, her rash in her diarrhea, completely resolved. Of note celiac sprue panel October 2014 was normal.   HPI: This is a very pleasant 34 year old woman whom I last saw about a year and a half ago  Chief complaint is improved diarrhea, question about underlying celiac sprue  Diagnosed with dermatomyositis.  Was put on methotrexate for 8 weeks (did not help the rash but did help the diarrhea a bit). Stopped gluten and after 1 monht was completely better.  Currently she is mostly gluten-free however she does eat gluten in small amounts probably on a daily or every other day basis   Past Medical History  Diagnosis Date  . Anemia   . Thyroid disease   . Anxiety   . Dermatomyositis   . Microscopic colitis     Past Surgical History  Procedure Laterality Date  . Laproscopy for endometrosis  2011  . Tonsillectomy and adenoidectomy  1986  . Muscle biopsy      Current Outpatient Prescriptions  Medication Sig Dispense Refill  . FLUoxetine (PROZAC) 20 MG capsule Take 1 capsule by mouth daily.    Marland Kitchen levothyroxine (SYNTHROID, LEVOTHROID) 75 MCG tablet Take 75 mcg by mouth daily before breakfast.    . traZODone (DESYREL) 50 MG tablet Take 1 tablet by mouth at bedtime as needed.     No current  facility-administered medications for this visit.    Allergies as of 09/23/2014 - Review Complete 09/23/2014  Allergen Reaction Noted  . Ciprocinonide [fluocinolone] Rash 12/02/2012  . Penicillins Rash 12/02/2012    Family History  Problem Relation Age of Onset  . Hypertension Father   . Hyperlipidemia Father   . Colon cancer Neg Hx     Social History   Social History  . Marital Status: Married    Spouse Name: N/A  . Number of Children: 1  . Years of Education: N/A   Occupational History  . RN [employer]   Social History Main Topics  . Smoking status: Never Smoker   . Smokeless tobacco: Never Used  . Alcohol Use: No  . Drug Use: No  . Sexual Activity: Not on file   Other Topics Concern  . Not on file   Social History Narrative     Physical Exam: Ht 5\' 5"  (1.651 m)  Wt 152 lb (68.947 kg)  BMI 25.29 kg/m2 Constitutional: generally well-appearing Psychiatric: alert and oriented x3 Abdomen: soft, nontender, nondistended, no obvious ascites, no peritoneal signs, normal bowel sounds   Assessment and plan: 34 y.o. female with resolved chronic diarrhea, question underlying celiac sprue  Dermatomyositis is not typically associated with celiac sprue. Dermatitis herpetiformis is however. I reviewed her biopsies again from her colonoscopy 2014 in the report is pretty clear that she had lymphocytic colitis. I am certainly very happy that she is so improved since she has been mostly abstaining  from gluten. She also admits that if she eats more than usual gluten then she will have diarrhea and start to develop a rash again. I'm going to repeat her celiac sprue panel and she will continue to mainly avoids gluten. She knows to call if she has further questions or concerns.   Rob Bunting, MD [facility] 09/23/2014, 8:54 AM

## 2014-09-26 LAB — CELIAC PANEL 10
ENDOMYSIAL SCREEN: NEGATIVE
Gliadin IgA: 2 Units (ref <20)
Gliadin IgG: 4 Units (ref <20)
IGA: 97 mg/dL (ref 69–380)
TISSUE TRANSGLUT AB: 1 U/mL (ref <6)
Tissue Transglutaminase Ab, IgA: 1 U/mL (ref <4)

## 2014-09-30 ENCOUNTER — Other Ambulatory Visit: Payer: Self-pay | Admitting: Endocrinology

## 2014-09-30 DIAGNOSIS — E049 Nontoxic goiter, unspecified: Secondary | ICD-10-CM

## 2014-10-03 ENCOUNTER — Ambulatory Visit
Admission: RE | Admit: 2014-10-03 | Discharge: 2014-10-03 | Disposition: A | Discharge status: Home / Self Care | Payer: 59 | Source: Ambulatory Visit | Attending: Endocrinology | Admitting: Endocrinology

## 2014-10-03 DIAGNOSIS — E049 Nontoxic goiter, unspecified: Secondary | ICD-10-CM

## 2015-12-05 ENCOUNTER — Other Ambulatory Visit: Payer: Self-pay | Admitting: Endocrinology

## 2015-12-05 DIAGNOSIS — E049 Nontoxic goiter, unspecified: Secondary | ICD-10-CM

## 2015-12-08 ENCOUNTER — Ambulatory Visit
Admission: RE | Admit: 2015-12-08 | Discharge: 2015-12-08 | Disposition: A | Discharge status: Home / Self Care | Payer: 59 | Source: Ambulatory Visit | Attending: Endocrinology | Admitting: Endocrinology

## 2015-12-08 DIAGNOSIS — E049 Nontoxic goiter, unspecified: Secondary | ICD-10-CM

## 2017-12-09 ENCOUNTER — Other Ambulatory Visit (HOSPITAL_COMMUNITY): Payer: Self-pay | Admitting: *Deleted

## 2017-12-10 ENCOUNTER — Ambulatory Visit (HOSPITAL_COMMUNITY)
Admission: RE | Admit: 2017-12-10 | Discharge: 2017-12-10 | Disposition: A | Discharge status: Home / Self Care | Payer: 59 | Source: Ambulatory Visit | Attending: Internal Medicine | Admitting: Internal Medicine

## 2017-12-10 ENCOUNTER — Encounter (HOSPITAL_COMMUNITY): Payer: Self-pay

## 2017-12-10 DIAGNOSIS — D649 Anemia, unspecified: Secondary | ICD-10-CM | POA: Insufficient documentation

## 2017-12-10 MED ORDER — SODIUM CHLORIDE 0.9 % IV SOLN
510.0000 mg | INTRAVENOUS | Status: DC
  Administered 2017-12-10: 510 mg via INTRAVENOUS
  Filled 2017-12-10: qty 17

## 2017-12-10 NOTE — Discharge Instructions (Signed)

## 2017-12-17 ENCOUNTER — Ambulatory Visit (HOSPITAL_COMMUNITY)
Admission: RE | Admit: 2017-12-17 | Discharge: 2017-12-17 | Disposition: A | Discharge status: Home / Self Care | Payer: 59 | Source: Ambulatory Visit | Attending: Internal Medicine | Admitting: Internal Medicine

## 2017-12-17 DIAGNOSIS — D649 Anemia, unspecified: Secondary | ICD-10-CM | POA: Diagnosis not present

## 2017-12-17 MED ORDER — FERUMOXYTOL INJECTION 510 MG/17 ML
510.0000 mg | INTRAVENOUS | Status: DC
  Administered 2017-12-17: 510 mg via INTRAVENOUS
  Filled 2017-12-17: qty 17

## 2018-07-14 ENCOUNTER — Other Ambulatory Visit: Payer: Self-pay | Admitting: Obstetrics and Gynecology

## 2018-07-14 DIAGNOSIS — Z803 Family history of malignant neoplasm of breast: Secondary | ICD-10-CM

## 2018-08-04 ENCOUNTER — Ambulatory Visit
Admission: RE | Admit: 2018-08-04 | Discharge: 2018-08-04 | Disposition: A | Discharge status: Home / Self Care | Payer: PRIVATE HEALTH INSURANCE | Source: Ambulatory Visit | Attending: Obstetrics and Gynecology | Admitting: Obstetrics and Gynecology

## 2018-08-04 DIAGNOSIS — Z803 Family history of malignant neoplasm of breast: Secondary | ICD-10-CM

## 2018-08-04 MED ORDER — GADOBUTROL 1 MMOL/ML IV SOLN
7.0000 mL | Freq: Once | INTRAVENOUS | Status: AC | PRN
  Administered 2018-08-04: 7 mL via INTRAVENOUS

## 2019-04-29 ENCOUNTER — Ambulatory Visit: Payer: PRIVATE HEALTH INSURANCE

## 2019-07-07 LAB — OB RESULTS CONSOLE GC/CHLAMYDIA
Chlamydia: NEGATIVE
Gonorrhea: NEGATIVE

## 2019-09-14 ENCOUNTER — Other Ambulatory Visit: Payer: Self-pay | Admitting: Obstetrics and Gynecology

## 2019-09-17 ENCOUNTER — Other Ambulatory Visit: Payer: Self-pay | Admitting: *Deleted

## 2019-09-17 ENCOUNTER — Other Ambulatory Visit: Payer: Self-pay | Admitting: Obstetrics and Gynecology

## 2019-09-17 ENCOUNTER — Other Ambulatory Visit: Payer: Self-pay

## 2019-09-17 ENCOUNTER — Ambulatory Visit: Payer: No Typology Code available for payment source | Attending: Obstetrics and Gynecology

## 2019-09-17 ENCOUNTER — Ambulatory Visit: Payer: No Typology Code available for payment source | Admitting: *Deleted

## 2019-09-17 VITALS — BP 108/66 | HR 102

## 2019-09-17 DIAGNOSIS — Z363 Encounter for antenatal screening for malformations: Secondary | ICD-10-CM

## 2019-09-17 DIAGNOSIS — O09812 Supervision of pregnancy resulting from assisted reproductive technology, second trimester: Secondary | ICD-10-CM | POA: Diagnosis not present

## 2019-09-17 DIAGNOSIS — Z3A21 21 weeks gestation of pregnancy: Secondary | ICD-10-CM

## 2019-09-17 DIAGNOSIS — O283 Abnormal ultrasonic finding on antenatal screening of mother: Secondary | ICD-10-CM | POA: Insufficient documentation

## 2019-09-17 DIAGNOSIS — O09522 Supervision of elderly multigravida, second trimester: Secondary | ICD-10-CM

## 2019-09-17 DIAGNOSIS — O4400 Placenta previa specified as without hemorrhage, unspecified trimester: Secondary | ICD-10-CM

## 2019-09-17 DIAGNOSIS — O99282 Endocrine, nutritional and metabolic diseases complicating pregnancy, second trimester: Secondary | ICD-10-CM

## 2019-09-17 DIAGNOSIS — E039 Hypothyroidism, unspecified: Secondary | ICD-10-CM

## 2019-09-17 DIAGNOSIS — O09212 Supervision of pregnancy with history of pre-term labor, second trimester: Secondary | ICD-10-CM

## 2019-10-15 ENCOUNTER — Other Ambulatory Visit: Payer: Self-pay

## 2019-10-15 ENCOUNTER — Ambulatory Visit: Payer: No Typology Code available for payment source | Admitting: *Deleted

## 2019-10-15 ENCOUNTER — Ambulatory Visit: Payer: No Typology Code available for payment source | Attending: Obstetrics and Gynecology

## 2019-10-15 VITALS — BP 115/69 | HR 99

## 2019-10-15 DIAGNOSIS — O09522 Supervision of elderly multigravida, second trimester: Secondary | ICD-10-CM

## 2019-10-15 DIAGNOSIS — O09812 Supervision of pregnancy resulting from assisted reproductive technology, second trimester: Secondary | ICD-10-CM | POA: Diagnosis not present

## 2019-10-15 DIAGNOSIS — O4442 Low lying placenta NOS or without hemorrhage, second trimester: Secondary | ICD-10-CM

## 2019-10-15 DIAGNOSIS — O43122 Velamentous insertion of umbilical cord, second trimester: Secondary | ICD-10-CM

## 2019-10-15 DIAGNOSIS — O4400 Placenta previa specified as without hemorrhage, unspecified trimester: Secondary | ICD-10-CM | POA: Diagnosis not present

## 2019-10-15 DIAGNOSIS — Z362 Encounter for other antenatal screening follow-up: Secondary | ICD-10-CM

## 2019-10-15 DIAGNOSIS — O09212 Supervision of pregnancy with history of pre-term labor, second trimester: Secondary | ICD-10-CM | POA: Diagnosis not present

## 2019-10-15 DIAGNOSIS — E039 Hypothyroidism, unspecified: Secondary | ICD-10-CM

## 2019-10-15 DIAGNOSIS — Z3A25 25 weeks gestation of pregnancy: Secondary | ICD-10-CM

## 2019-10-15 DIAGNOSIS — O99282 Endocrine, nutritional and metabolic diseases complicating pregnancy, second trimester: Secondary | ICD-10-CM

## 2019-10-15 DIAGNOSIS — O321XX0 Maternal care for breech presentation, not applicable or unspecified: Secondary | ICD-10-CM

## 2019-10-18 ENCOUNTER — Other Ambulatory Visit: Payer: Self-pay | Admitting: *Deleted

## 2019-10-18 DIAGNOSIS — O43129 Velamentous insertion of umbilical cord, unspecified trimester: Secondary | ICD-10-CM

## 2019-11-25 ENCOUNTER — Ambulatory Visit: Payer: No Typology Code available for payment source | Admitting: *Deleted

## 2019-11-25 ENCOUNTER — Other Ambulatory Visit: Payer: Self-pay

## 2019-11-25 ENCOUNTER — Ambulatory Visit: Payer: No Typology Code available for payment source | Attending: Obstetrics and Gynecology

## 2019-11-25 ENCOUNTER — Encounter: Payer: Self-pay | Admitting: *Deleted

## 2019-11-25 VITALS — BP 110/63 | HR 102

## 2019-11-25 DIAGNOSIS — O09523 Supervision of elderly multigravida, third trimester: Secondary | ICD-10-CM | POA: Diagnosis not present

## 2019-11-25 DIAGNOSIS — O09529 Supervision of elderly multigravida, unspecified trimester: Secondary | ICD-10-CM | POA: Insufficient documentation

## 2019-11-25 DIAGNOSIS — O09213 Supervision of pregnancy with history of pre-term labor, third trimester: Secondary | ICD-10-CM

## 2019-11-25 DIAGNOSIS — E039 Hypothyroidism, unspecified: Secondary | ICD-10-CM

## 2019-11-25 DIAGNOSIS — O09813 Supervision of pregnancy resulting from assisted reproductive technology, third trimester: Secondary | ICD-10-CM | POA: Diagnosis not present

## 2019-11-25 DIAGNOSIS — Z362 Encounter for other antenatal screening follow-up: Secondary | ICD-10-CM

## 2019-11-25 DIAGNOSIS — O43129 Velamentous insertion of umbilical cord, unspecified trimester: Secondary | ICD-10-CM | POA: Insufficient documentation

## 2019-11-25 DIAGNOSIS — O368930 Maternal care for other specified fetal problems, third trimester, not applicable or unspecified: Secondary | ICD-10-CM

## 2019-11-25 DIAGNOSIS — Z3A31 31 weeks gestation of pregnancy: Secondary | ICD-10-CM

## 2019-11-25 DIAGNOSIS — O99283 Endocrine, nutritional and metabolic diseases complicating pregnancy, third trimester: Secondary | ICD-10-CM

## 2019-11-26 ENCOUNTER — Other Ambulatory Visit: Payer: Self-pay | Admitting: *Deleted

## 2019-11-26 DIAGNOSIS — O43129 Velamentous insertion of umbilical cord, unspecified trimester: Secondary | ICD-10-CM

## 2019-12-23 ENCOUNTER — Ambulatory Visit: Payer: No Typology Code available for payment source | Admitting: *Deleted

## 2019-12-23 ENCOUNTER — Ambulatory Visit: Payer: No Typology Code available for payment source | Attending: Obstetrics and Gynecology

## 2019-12-23 ENCOUNTER — Other Ambulatory Visit: Payer: Self-pay

## 2019-12-23 ENCOUNTER — Encounter: Payer: Self-pay | Admitting: *Deleted

## 2019-12-23 VITALS — BP 117/71 | HR 96

## 2019-12-23 DIAGNOSIS — O09213 Supervision of pregnancy with history of pre-term labor, third trimester: Secondary | ICD-10-CM

## 2019-12-23 DIAGNOSIS — O99283 Endocrine, nutritional and metabolic diseases complicating pregnancy, third trimester: Secondary | ICD-10-CM

## 2019-12-23 DIAGNOSIS — E039 Hypothyroidism, unspecified: Secondary | ICD-10-CM

## 2019-12-23 DIAGNOSIS — O43129 Velamentous insertion of umbilical cord, unspecified trimester: Secondary | ICD-10-CM | POA: Diagnosis not present

## 2019-12-23 DIAGNOSIS — O403XX0 Polyhydramnios, third trimester, not applicable or unspecified: Secondary | ICD-10-CM

## 2019-12-23 DIAGNOSIS — Z3A35 35 weeks gestation of pregnancy: Secondary | ICD-10-CM

## 2019-12-23 DIAGNOSIS — O09813 Supervision of pregnancy resulting from assisted reproductive technology, third trimester: Secondary | ICD-10-CM

## 2019-12-23 DIAGNOSIS — O09523 Supervision of elderly multigravida, third trimester: Secondary | ICD-10-CM | POA: Insufficient documentation

## 2019-12-29 ENCOUNTER — Encounter: Payer: Self-pay | Admitting: *Deleted

## 2019-12-29 ENCOUNTER — Ambulatory Visit: Payer: No Typology Code available for payment source | Attending: Obstetrics and Gynecology

## 2019-12-29 ENCOUNTER — Ambulatory Visit: Payer: No Typology Code available for payment source | Admitting: *Deleted

## 2019-12-29 ENCOUNTER — Other Ambulatory Visit: Payer: Self-pay

## 2019-12-29 VITALS — BP 136/80 | HR 111

## 2019-12-29 DIAGNOSIS — O43123 Velamentous insertion of umbilical cord, third trimester: Secondary | ICD-10-CM

## 2019-12-29 DIAGNOSIS — E039 Hypothyroidism, unspecified: Secondary | ICD-10-CM

## 2019-12-29 DIAGNOSIS — O403XX0 Polyhydramnios, third trimester, not applicable or unspecified: Secondary | ICD-10-CM

## 2019-12-29 DIAGNOSIS — O43129 Velamentous insertion of umbilical cord, unspecified trimester: Secondary | ICD-10-CM | POA: Diagnosis not present

## 2019-12-29 DIAGNOSIS — O09523 Supervision of elderly multigravida, third trimester: Secondary | ICD-10-CM

## 2019-12-29 DIAGNOSIS — O09213 Supervision of pregnancy with history of pre-term labor, third trimester: Secondary | ICD-10-CM

## 2019-12-29 DIAGNOSIS — O99283 Endocrine, nutritional and metabolic diseases complicating pregnancy, third trimester: Secondary | ICD-10-CM

## 2019-12-29 DIAGNOSIS — Z3A35 35 weeks gestation of pregnancy: Secondary | ICD-10-CM

## 2019-12-29 DIAGNOSIS — O09813 Supervision of pregnancy resulting from assisted reproductive technology, third trimester: Secondary | ICD-10-CM | POA: Diagnosis not present

## 2020-01-04 ENCOUNTER — Other Ambulatory Visit: Payer: Self-pay

## 2020-01-04 ENCOUNTER — Inpatient Hospital Stay (EMERGENCY_DEPARTMENT_HOSPITAL)
Admission: AD | Admit: 2020-01-04 | Discharge: 2020-01-04 | Disposition: A | Discharge status: Home / Self Care | Payer: No Typology Code available for payment source | Source: Home / Self Care | Attending: Obstetrics and Gynecology | Admitting: Obstetrics and Gynecology

## 2020-01-04 ENCOUNTER — Encounter (HOSPITAL_COMMUNITY): Payer: Self-pay | Admitting: Obstetrics and Gynecology

## 2020-01-04 DIAGNOSIS — O99891 Other specified diseases and conditions complicating pregnancy: Secondary | ICD-10-CM | POA: Diagnosis not present

## 2020-01-04 DIAGNOSIS — Z3A36 36 weeks gestation of pregnancy: Secondary | ICD-10-CM | POA: Diagnosis not present

## 2020-01-04 DIAGNOSIS — R030 Elevated blood-pressure reading, without diagnosis of hypertension: Secondary | ICD-10-CM

## 2020-01-04 DIAGNOSIS — Z3689 Encounter for other specified antenatal screening: Secondary | ICD-10-CM

## 2020-01-04 DIAGNOSIS — O26893 Other specified pregnancy related conditions, third trimester: Secondary | ICD-10-CM | POA: Insufficient documentation

## 2020-01-04 LAB — CBC
HCT: 31.1 % — ABNORMAL LOW (ref 36.0–46.0)
Hemoglobin: 10.2 g/dL — ABNORMAL LOW (ref 12.0–15.0)
MCH: 30.2 pg (ref 26.0–34.0)
MCHC: 32.8 g/dL (ref 30.0–36.0)
MCV: 92 fL (ref 80.0–100.0)
Platelets: 124 10*3/uL — ABNORMAL LOW (ref 150–400)
RBC: 3.38 MIL/uL — ABNORMAL LOW (ref 3.87–5.11)
RDW: 12.6 % (ref 11.5–15.5)
WBC: 8.7 10*3/uL (ref 4.0–10.5)
nRBC: 0 % (ref 0.0–0.2)

## 2020-01-04 LAB — COMPREHENSIVE METABOLIC PANEL
ALT: 16 U/L (ref 0–44)
AST: 22 U/L (ref 15–41)
Albumin: 2.5 g/dL — ABNORMAL LOW (ref 3.5–5.0)
Alkaline Phosphatase: 161 U/L — ABNORMAL HIGH (ref 38–126)
Anion gap: 8 (ref 5–15)
BUN: 5 mg/dL — ABNORMAL LOW (ref 6–20)
CO2: 24 mmol/L (ref 22–32)
Calcium: 8.9 mg/dL (ref 8.9–10.3)
Chloride: 103 mmol/L (ref 98–111)
Creatinine, Ser: 0.74 mg/dL (ref 0.44–1.00)
GFR, Estimated: >60 mL/min (ref >60)
Glucose, Bld: 90 mg/dL (ref 70–99)
Potassium: 3.9 mmol/L (ref 3.5–5.1)
Sodium: 135 mmol/L (ref 135–145)
Total Bilirubin: 0.1 mg/dL — ABNORMAL LOW (ref 0.3–1.2)
Total Protein: 5.5 g/dL — ABNORMAL LOW (ref 6.5–8.1)

## 2020-01-04 LAB — PROTEIN / CREATININE RATIO, URINE
Creatinine, Urine: 140.52 mg/dL
Protein Creatinine Ratio: 0.4 mg/mg{Cre} — ABNORMAL HIGH (ref 0.00–0.15)
Total Protein, Urine: 56 mg/dL

## 2020-01-04 NOTE — MAU Note (Signed)
Went to dr's appt today, BP today150/90, 160/90 yesterday. Increased swelling in hands, feet and lower legs, has had HA (not currently), denies visual changes or epigastric pain. No bleeding or LOF

## 2020-01-04 NOTE — Discharge Instructions (Signed)
Preeclampsia and Eclampsia Preeclampsia is a serious condition that may develop during pregnancy. This condition causes high blood pressure and increased protein in your urine along with other symptoms, such as headaches and vision changes. These symptoms may develop as the condition gets worse. Preeclampsia may occur at 20 weeks of pregnancy or later. Diagnosing and treating preeclampsia early is very important. If not treated early, it can cause serious problems for you and your baby. One problem it can lead to is eclampsia. Eclampsia is a condition that causes muscle jerking or shaking (convulsions or seizures) and other serious problems for the mother. During pregnancy, delivering your baby may be the best treatment for preeclampsia or eclampsia. For most women, preeclampsia and eclampsia symptoms go away after giving birth. In rare cases, a woman may develop preeclampsia after giving birth (postpartum preeclampsia). This usually occurs within 48 hours after childbirth but may occur up to 6 weeks after giving birth. What are the causes? The cause of preeclampsia is not known. What increases the risk? The following risk factors make you more likely to develop preeclampsia:  Being pregnant for the first time.  Having had preeclampsia during a past pregnancy.  Having a family history of preeclampsia.  Having high blood pressure.  Being pregnant with more than one baby.  Being 35 or older.  Being African-American.  Having kidney disease or diabetes.  Having medical conditions such as lupus or blood diseases.  Being very overweight (obese). What are the signs or symptoms? The most common symptoms are:  Severe headaches.  Vision problems, such as blurred or double vision.  Abdominal pain, especially upper abdominal pain. Other symptoms that may develop as the condition gets worse include:  Sudden weight gain.  Sudden swelling of the hands, face, legs, and feet.  Severe nausea  and vomiting.  Numbness in the face, arms, legs, and feet.  Dizziness.  Urinating less than usual.  Slurred speech.  Convulsions or seizures. How is this diagnosed? There are no screening tests for preeclampsia. Your health care provider will ask you about symptoms and check for signs of preeclampsia during your prenatal visits. You may also have tests that include:  Checking your blood pressure.  Urine tests to check for protein. Your health care provider will check for this at every prenatal visit.  Blood tests.  Monitoring your baby's heart rate.  Ultrasound. How is this treated? You and your health care provider will determine the treatment approach that is best for you. Treatment may include:  Having more frequent prenatal exams to check for signs of preeclampsia, if you have an increased risk for preeclampsia.  Medicine to lower your blood pressure.  Staying in the hospital, if your condition is severe. There, treatment will focus on controlling your blood pressure and the amount of fluids in your body (fluid retention).  Taking medicine (magnesium sulfate) to prevent seizures. This may be given as an injection or through an IV.  Taking a low-dose aspirin during your pregnancy.  Delivering your baby early. You may have your labor started with medicine (induced), or you may have a cesarean delivery. Follow these instructions at home: Eating and drinking   Drink enough fluid to keep your urine pale yellow.  Avoid caffeine. Lifestyle  Do not use any products that contain nicotine or tobacco, such as cigarettes and e-cigarettes. If you need help quitting, ask your health care provider.  Do not use alcohol or drugs.  Avoid stress as much as possible. Rest and get   plenty of sleep. General instructions  Take over-the-counter and prescription medicines only as told by your health care provider.  When lying down, lie on your left side. This keeps pressure off your  major blood vessels.  When sitting or lying down, raise (elevate) your feet. Try putting some pillows underneath your lower legs.  Exercise regularly. Ask your health care provider what kinds of exercise are best for you.  Keep all follow-up and prenatal visits as told by your health care provider. This is important. How is this prevented? There is no known way of preventing preeclampsia or eclampsia from developing. However, to lower your risk of complications and detect problems early:  Get regular prenatal care. Your health care provider may be able to diagnose and treat the condition early.  Maintain a healthy weight. Ask your health care provider for help managing weight gain during pregnancy.  Work with your health care provider to manage any long-term (chronic) health conditions you have, such as diabetes or kidney problems.  You may have tests of your blood pressure and kidney function after giving birth.  Your health care provider may have you take low-dose aspirin during your next pregnancy. Contact a health care provider if:  You have symptoms that your health care provider told you may require more treatment or monitoring, such as: ? Headaches. ? Nausea or vomiting. ? Abdominal pain. ? Dizziness. ? Light-headedness. Get help right away if:  You have severe: ? Abdominal pain. ? Headaches that do not get better. ? Dizziness. ? Vision problems. ? Confusion. ? Nausea or vomiting.  You have any of the following: ? A seizure. ? Sudden, rapid weight gain. ? Sudden swelling in your hands, ankles, or face. ? Trouble moving any part of your body. ? Numbness in any part of your body. ? Trouble speaking. ? Abnormal bleeding.  You faint. Summary  Preeclampsia is a serious condition that may develop during pregnancy.  This condition causes high blood pressure and increased protein in your urine along with other symptoms, such as headaches and vision  changes.  Diagnosing and treating preeclampsia early is very important. If not treated early, it can cause serious problems for you and your baby.  Get help right away if you have symptoms that your health care provider told you to watch for. This information is not intended to replace advice given to you by your health care provider. Make sure you discuss any questions you have with your health care provider. Document Revised: 09/23/2017 Document Reviewed: 08/28/2015 [employer] Patient Education  2020 [employer]  

## 2020-01-04 NOTE — MAU Note (Signed)
Was 3/70/ vtx in office today.

## 2020-01-04 NOTE — [facility] Provider Note (Signed)
Annette Harris 92.0 80.0 - 100.0 fL   MCH 30.2 26.0 - 34.0 pg   MCHC 32.8 30.0 - 36.0 g/dL   RDW 40.9 81.1 - 91.4 %   Platelets 124 (L) 150 - 400 K/uL   nRBC 0.0 0.0 - 0.2 %  Comprehensive metabolic panel     Status: Abnormal   Collection Time: 01/04/20 12:00 PM  Result Value Ref Range   Sodium 135 135 - 145 mmol/L   Potassium 3.9 3.5 - 5.1 mmol/L   Chloride 103 98 - 111 mmol/L   CO2 24 22 - 32 mmol/L   Glucose, Bld 90 70 - 99 mg/dL   BUN 5 (L) 6 - 20 mg/dL   Creatinine, Ser 7.82 0.44 - 1.00 mg/dL   Calcium 8.9 8.9 - 95.6 mg/dL   Total Protein 5.5 (L) 6.5 - 8.1 g/dL   Albumin 2.5 (L) 3.5 - 5.0 g/dL   AST 22 15 - 41 U/L   ALT 16 0 - 44 U/L   Alkaline Phosphatase 161 (H) 38 - 126 U/L   Total Bilirubin 0.1 (L) 0.3 - 1.2 mg/dL   GFR, Estimated >21 >30 mL/min   Anion gap 8 5 - 15   [place] MFM FETAL BPP WO NON STRESS  Result Date: 12/29/2019 ----------------------------------------------------------------------  OBSTETRICS REPORT                       (Signed Final 12/29/2019 02:00 pm) ---------------------------------------------------------------------- Patient Info  ID #:       [id number]                          D.O.B.:  30-Jan-1981 (39 yrs)  Name:       Annette Harris                 Visit Date: 12/29/2019 01:27 pm ---------------------------------------------------------------------- Performed By  Attending:        Noralee Space MD        Ref. Address:     [facility]                                                             [address]  Annette Harris 92.0 80.0 - 100.0 fL   MCH 30.2 26.0 - 34.0 pg   MCHC 32.8 30.0 - 36.0 g/dL   RDW 40.9 81.1 - 91.4 %   Platelets 124 (L) 150 - 400 K/uL   nRBC 0.0 0.0 - 0.2 %  Comprehensive metabolic panel     Status: Abnormal   Collection Time: 01/04/20 12:00 PM  Result Value Ref Range   Sodium 135 135 - 145 mmol/L   Potassium 3.9 3.5 - 5.1 mmol/L   Chloride 103 98 - 111 mmol/L   CO2 24 22 - 32 mmol/L   Glucose, Bld 90 70 - 99 mg/dL   BUN 5 (L) 6 - 20 mg/dL   Creatinine, Ser 7.82 0.44 - 1.00 mg/dL   Calcium 8.9 8.9 - 95.6 mg/dL   Total Protein 5.5 (L) 6.5 - 8.1 g/dL   Albumin 2.5 (L) 3.5 - 5.0 g/dL   AST 22 15 - 41 U/L   ALT 16 0 - 44 U/L   Alkaline Phosphatase 161 (H) 38 - 126 U/L   Total Bilirubin 0.1 (L) 0.3 - 1.2 mg/dL   GFR, Estimated >21 >30 mL/min   Anion gap 8 5 - 15   [place] MFM FETAL BPP WO NON STRESS  Result Date: 12/29/2019 ----------------------------------------------------------------------  OBSTETRICS REPORT                       (Signed Final 12/29/2019 02:00 pm) ---------------------------------------------------------------------- Patient Info  ID #:       [id number]                          D.O.B.:  30-Jan-1981 (39 yrs)  Name:       Annette Harris                 Visit Date: 12/29/2019 01:27 pm ---------------------------------------------------------------------- Performed By  Attending:        Noralee Space MD        Ref. Address:     [facility]                                                             [address]  1  Fetal Heart Rate(bpm):  148  Cardiac Activity:       Observed  Presentation:           Cephalic  Placenta:               Posterior  P. Cord Insertion:      Velamentous insertion prev see  Amniotic Fluid  AFI FV:      Within normal limits  AFI Sum(cm)     %Tile       Largest Pocket(cm)  21.09           80          7.42  RUQ(cm)       RLQ(cm)       LUQ(cm)        LLQ(cm)  5.39          7.42           5.11           3.17 ---------------------------------------------------------------------- Biophysical Evaluation  Amniotic F.V:   Within normal limits       F. Tone:        Observed  F. Movement:    Observed                   Score:          8/8  F. Breathing:   Observed ---------------------------------------------------------------------- Biometry  LV:        3.7  mm ---------------------------------------------------------------------- OB History  Gravidity:    2         Term:   0        Prem:   1        SAB:   0  TOP:          0       Ectopic:  0        Living: 1 ---------------------------------------------------------------------- Gestational Age  Best:          35w 6d     Det. ByMurtis Sink:  Embryo Transfer          EDD:   01/27/20 ---------------------------------------------------------------------- Impression  Velamentous cord insertion.  Polyhydramnios.  Patient return for antenatal testing.  Amniotic fluid is normal and good fetal activity seen.  Antenatal testing is reassuring.  BPP 8/8.  Cephalic  presentation.  We reassured the patient of the findings (polyhydramnios is  resolved).  Patient reported discomfort in her neck and bilateral swelling  of feet.  On examination no thyromegaly or nodules were felt.  Her blood pressure today at her office is 136/80 mmHg.  I have reassured her that edema is common in pregnancy  and in the absence of hypertension we do not expect adverse  outcomes. ---------------------------------------------------------------------- Recommendations  -Continue weekly BPP till delivery. ----------------------------------------------------------------------                  Noralee Spaceavi Shankar, MD Electronically Signed Final Report   12/29/2019 02:00 pm ----------------------------------------------------------------------  US MFM OB FOLLOW UP  Result Date: 12/23/2019 ----------------------------------------------------------------------  OBSTETRICS REPORT                       (Signed  Final 12/23/2019 02:38 pm) ---------------------------------------------------------------------- Patient Info  ID #:       [id number]                          D.O.B.:  [birth year] (39 yrs)  Name:  Annette Harris 92.0 80.0 - 100.0 fL   MCH 30.2 26.0 - 34.0 pg   MCHC 32.8 30.0 - 36.0 g/dL   RDW 40.9 81.1 - 91.4 %   Platelets 124 (L) 150 - 400 K/uL   nRBC 0.0 0.0 - 0.2 %  Comprehensive metabolic panel     Status: Abnormal   Collection Time: 01/04/20 12:00 PM  Result Value Ref Range   Sodium 135 135 - 145 mmol/L   Potassium 3.9 3.5 - 5.1 mmol/L   Chloride 103 98 - 111 mmol/L   CO2 24 22 - 32 mmol/L   Glucose, Bld 90 70 - 99 mg/dL   BUN 5 (L) 6 - 20 mg/dL   Creatinine, Ser 7.82 0.44 - 1.00 mg/dL   Calcium 8.9 8.9 - 95.6 mg/dL   Total Protein 5.5 (L) 6.5 - 8.1 g/dL   Albumin 2.5 (L) 3.5 - 5.0 g/dL   AST 22 15 - 41 U/L   ALT 16 0 - 44 U/L   Alkaline Phosphatase 161 (H) 38 - 126 U/L   Total Bilirubin 0.1 (L) 0.3 - 1.2 mg/dL   GFR, Estimated >21 >30 mL/min   Anion gap 8 5 - 15   [place] MFM FETAL BPP WO NON STRESS  Result Date: 12/29/2019 ----------------------------------------------------------------------  OBSTETRICS REPORT                       (Signed Final 12/29/2019 02:00 pm) ---------------------------------------------------------------------- Patient Info  ID #:       [id number]                          D.O.B.:  30-Jan-1981 (39 yrs)  Name:       Annette Harris                 Visit Date: 12/29/2019 01:27 pm ---------------------------------------------------------------------- Performed By  Attending:        Noralee Space MD        Ref. Address:     [facility]                                                             [address]  1  Fetal Heart Rate(bpm):  148  Cardiac Activity:       Observed  Presentation:           Cephalic  Placenta:               Posterior  P. Cord Insertion:      Velamentous insertion prev see  Amniotic Fluid  AFI FV:      Within normal limits  AFI Sum(cm)     %Tile       Largest Pocket(cm)  21.09           80          7.42  RUQ(cm)       RLQ(cm)       LUQ(cm)        LLQ(cm)  5.39          7.42           5.11           3.17 ---------------------------------------------------------------------- Biophysical Evaluation  Amniotic F.V:   Within normal limits       F. Tone:        Observed  F. Movement:    Observed                   Score:          8/8  F. Breathing:   Observed ---------------------------------------------------------------------- Biometry  LV:        3.7  mm ---------------------------------------------------------------------- OB History  Gravidity:    2         Term:   0        Prem:   1        SAB:   0  TOP:          0       Ectopic:  0        Living: 1 ---------------------------------------------------------------------- Gestational Age  Best:          35w 6d     Det. ByMurtis Sink:  Embryo Transfer          EDD:   01/27/20 ---------------------------------------------------------------------- Impression  Velamentous cord insertion.  Polyhydramnios.  Patient return for antenatal testing.  Amniotic fluid is normal and good fetal activity seen.  Antenatal testing is reassuring.  BPP 8/8.  Cephalic  presentation.  We reassured the patient of the findings (polyhydramnios is  resolved).  Patient reported discomfort in her neck and bilateral swelling  of feet.  On examination no thyromegaly or nodules were felt.  Her blood pressure today at her office is 136/80 mmHg.  I have reassured her that edema is common in pregnancy  and in the absence of hypertension we do not expect adverse  outcomes. ---------------------------------------------------------------------- Recommendations  -Continue weekly BPP till delivery. ----------------------------------------------------------------------                  Noralee Spaceavi Shankar, MD Electronically Signed Final Report   12/29/2019 02:00 pm ----------------------------------------------------------------------  US MFM OB FOLLOW UP  Result Date: 12/23/2019 ----------------------------------------------------------------------  OBSTETRICS REPORT                       (Signed  Final 12/23/2019 02:38 pm) ---------------------------------------------------------------------- Patient Info  ID #:       [id number]                          D.O.B.:  [birth year] (39 yrs)  Name:  1  Fetal Heart Rate(bpm):  148  Cardiac Activity:       Observed  Presentation:           Cephalic  Placenta:               Posterior  P. Cord Insertion:      Velamentous insertion prev see  Amniotic Fluid  AFI FV:      Within normal limits  AFI Sum(cm)     %Tile       Largest Pocket(cm)  21.09           80          7.42  RUQ(cm)       RLQ(cm)       LUQ(cm)        LLQ(cm)  5.39          7.42           5.11           3.17 ---------------------------------------------------------------------- Biophysical Evaluation  Amniotic F.V:   Within normal limits       F. Tone:        Observed  F. Movement:    Observed                   Score:          8/8  F. Breathing:   Observed ---------------------------------------------------------------------- Biometry  LV:        3.7  mm ---------------------------------------------------------------------- OB History  Gravidity:    2         Term:   0        Prem:   1        SAB:   0  TOP:          0       Ectopic:  0        Living: 1 ---------------------------------------------------------------------- Gestational Age  Best:          35w 6d     Det. ByMurtis Sink:  Embryo Transfer          EDD:   01/27/20 ---------------------------------------------------------------------- Impression  Velamentous cord insertion.  Polyhydramnios.  Patient return for antenatal testing.  Amniotic fluid is normal and good fetal activity seen.  Antenatal testing is reassuring.  BPP 8/8.  Cephalic  presentation.  We reassured the patient of the findings (polyhydramnios is  resolved).  Patient reported discomfort in her neck and bilateral swelling  of feet.  On examination no thyromegaly or nodules were felt.  Her blood pressure today at her office is 136/80 mmHg.  I have reassured her that edema is common in pregnancy  and in the absence of hypertension we do not expect adverse  outcomes. ---------------------------------------------------------------------- Recommendations  -Continue weekly BPP till delivery. ----------------------------------------------------------------------                  Noralee Spaceavi Shankar, MD Electronically Signed Final Report   12/29/2019 02:00 pm ----------------------------------------------------------------------  US MFM OB FOLLOW UP  Result Date: 12/23/2019 ----------------------------------------------------------------------  OBSTETRICS REPORT                       (Signed  Final 12/23/2019 02:38 pm) ---------------------------------------------------------------------- Patient Info  ID #:       [id number]                          D.O.B.:  [birth year] (39 yrs)  Name:  Annette Harris 92.0 80.0 - 100.0 fL   MCH 30.2 26.0 - 34.0 pg   MCHC 32.8 30.0 - 36.0 g/dL   RDW 40.9 81.1 - 91.4 %   Platelets 124 (L) 150 - 400 K/uL   nRBC 0.0 0.0 - 0.2 %  Comprehensive metabolic panel     Status: Abnormal   Collection Time: 01/04/20 12:00 PM  Result Value Ref Range   Sodium 135 135 - 145 mmol/L   Potassium 3.9 3.5 - 5.1 mmol/L   Chloride 103 98 - 111 mmol/L   CO2 24 22 - 32 mmol/L   Glucose, Bld 90 70 - 99 mg/dL   BUN 5 (L) 6 - 20 mg/dL   Creatinine, Ser 7.82 0.44 - 1.00 mg/dL   Calcium 8.9 8.9 - 95.6 mg/dL   Total Protein 5.5 (L) 6.5 - 8.1 g/dL   Albumin 2.5 (L) 3.5 - 5.0 g/dL   AST 22 15 - 41 U/L   ALT 16 0 - 44 U/L   Alkaline Phosphatase 161 (H) 38 - 126 U/L   Total Bilirubin 0.1 (L) 0.3 - 1.2 mg/dL   GFR, Estimated >21 >30 mL/min   Anion gap 8 5 - 15   [place] MFM FETAL BPP WO NON STRESS  Result Date: 12/29/2019 ----------------------------------------------------------------------  OBSTETRICS REPORT                       (Signed Final 12/29/2019 02:00 pm) ---------------------------------------------------------------------- Patient Info  ID #:       [id number]                          D.O.B.:  30-Jan-1981 (39 yrs)  Name:       Annette Harris                 Visit Date: 12/29/2019 01:27 pm ---------------------------------------------------------------------- Performed By  Attending:        Noralee Space MD        Ref. Address:     [facility]                                                             [address]  1  Fetal Heart Rate(bpm):  148  Cardiac Activity:       Observed  Presentation:           Cephalic  Placenta:               Posterior  P. Cord Insertion:      Velamentous insertion prev see  Amniotic Fluid  AFI FV:      Within normal limits  AFI Sum(cm)     %Tile       Largest Pocket(cm)  21.09           80          7.42  RUQ(cm)       RLQ(cm)       LUQ(cm)        LLQ(cm)  5.39          7.42           5.11           3.17 ---------------------------------------------------------------------- Biophysical Evaluation  Amniotic F.V:   Within normal limits       F. Tone:        Observed  F. Movement:    Observed                   Score:          8/8  F. Breathing:   Observed ---------------------------------------------------------------------- Biometry  LV:        3.7  mm ---------------------------------------------------------------------- OB History  Gravidity:    2         Term:   0        Prem:   1        SAB:   0  TOP:          0       Ectopic:  0        Living: 1 ---------------------------------------------------------------------- Gestational Age  Best:          35w 6d     Det. ByMurtis Sink:  Embryo Transfer          EDD:   01/27/20 ---------------------------------------------------------------------- Impression  Velamentous cord insertion.  Polyhydramnios.  Patient return for antenatal testing.  Amniotic fluid is normal and good fetal activity seen.  Antenatal testing is reassuring.  BPP 8/8.  Cephalic  presentation.  We reassured the patient of the findings (polyhydramnios is  resolved).  Patient reported discomfort in her neck and bilateral swelling  of feet.  On examination no thyromegaly or nodules were felt.  Her blood pressure today at her office is 136/80 mmHg.  I have reassured her that edema is common in pregnancy  and in the absence of hypertension we do not expect adverse  outcomes. ---------------------------------------------------------------------- Recommendations  -Continue weekly BPP till delivery. ----------------------------------------------------------------------                  Noralee Spaceavi Shankar, MD Electronically Signed Final Report   12/29/2019 02:00 pm ----------------------------------------------------------------------  US MFM OB FOLLOW UP  Result Date: 12/23/2019 ----------------------------------------------------------------------  OBSTETRICS REPORT                       (Signed  Final 12/23/2019 02:38 pm) ---------------------------------------------------------------------- Patient Info  ID #:       [id number]                          D.O.B.:  [birth year] (39 yrs)  Name:  Annette Harris 92.0 80.0 - 100.0 fL   MCH 30.2 26.0 - 34.0 pg   MCHC 32.8 30.0 - 36.0 g/dL   RDW 40.9 81.1 - 91.4 %   Platelets 124 (L) 150 - 400 K/uL   nRBC 0.0 0.0 - 0.2 %  Comprehensive metabolic panel     Status: Abnormal   Collection Time: 01/04/20 12:00 PM  Result Value Ref Range   Sodium 135 135 - 145 mmol/L   Potassium 3.9 3.5 - 5.1 mmol/L   Chloride 103 98 - 111 mmol/L   CO2 24 22 - 32 mmol/L   Glucose, Bld 90 70 - 99 mg/dL   BUN 5 (L) 6 - 20 mg/dL   Creatinine, Ser 7.82 0.44 - 1.00 mg/dL   Calcium 8.9 8.9 - 95.6 mg/dL   Total Protein 5.5 (L) 6.5 - 8.1 g/dL   Albumin 2.5 (L) 3.5 - 5.0 g/dL   AST 22 15 - 41 U/L   ALT 16 0 - 44 U/L   Alkaline Phosphatase 161 (H) 38 - 126 U/L   Total Bilirubin 0.1 (L) 0.3 - 1.2 mg/dL   GFR, Estimated >21 >30 mL/min   Anion gap 8 5 - 15   [place] MFM FETAL BPP WO NON STRESS  Result Date: 12/29/2019 ----------------------------------------------------------------------  OBSTETRICS REPORT                       (Signed Final 12/29/2019 02:00 pm) ---------------------------------------------------------------------- Patient Info  ID #:       [id number]                          D.O.B.:  30-Jan-1981 (39 yrs)  Name:       Annette Harris                 Visit Date: 12/29/2019 01:27 pm ---------------------------------------------------------------------- Performed By  Attending:        Noralee Space MD        Ref. Address:     [facility]                                                             [address]  1  Fetal Heart Rate(bpm):  148  Cardiac Activity:       Observed  Presentation:           Cephalic  Placenta:               Posterior  P. Cord Insertion:      Velamentous insertion prev see  Amniotic Fluid  AFI FV:      Within normal limits  AFI Sum(cm)     %Tile       Largest Pocket(cm)  21.09           80          7.42  RUQ(cm)       RLQ(cm)       LUQ(cm)        LLQ(cm)  5.39          7.42           5.11           3.17 ---------------------------------------------------------------------- Biophysical Evaluation  Amniotic F.V:   Within normal limits       F. Tone:        Observed  F. Movement:    Observed                   Score:          8/8  F. Breathing:   Observed ---------------------------------------------------------------------- Biometry  LV:        3.7  mm ---------------------------------------------------------------------- OB History  Gravidity:    2         Term:   0        Prem:   1        SAB:   0  TOP:          0       Ectopic:  0        Living: 1 ---------------------------------------------------------------------- Gestational Age  Best:          35w 6d     Det. ByMurtis Sink:  Embryo Transfer          EDD:   01/27/20 ---------------------------------------------------------------------- Impression  Velamentous cord insertion.  Polyhydramnios.  Patient return for antenatal testing.  Amniotic fluid is normal and good fetal activity seen.  Antenatal testing is reassuring.  BPP 8/8.  Cephalic  presentation.  We reassured the patient of the findings (polyhydramnios is  resolved).  Patient reported discomfort in her neck and bilateral swelling  of feet.  On examination no thyromegaly or nodules were felt.  Her blood pressure today at her office is 136/80 mmHg.  I have reassured her that edema is common in pregnancy  and in the absence of hypertension we do not expect adverse  outcomes. ---------------------------------------------------------------------- Recommendations  -Continue weekly BPP till delivery. ----------------------------------------------------------------------                  Noralee Spaceavi Shankar, MD Electronically Signed Final Report   12/29/2019 02:00 pm ----------------------------------------------------------------------  US MFM OB FOLLOW UP  Result Date: 12/23/2019 ----------------------------------------------------------------------  OBSTETRICS REPORT                       (Signed  Final 12/23/2019 02:38 pm) ---------------------------------------------------------------------- Patient Info  ID #:       [id number]                          D.O.B.:  [birth year] (39 yrs)  Name:

## 2020-01-05 ENCOUNTER — Inpatient Hospital Stay (HOSPITAL_COMMUNITY): Payer: No Typology Code available for payment source | Admitting: Anesthesiology

## 2020-01-05 ENCOUNTER — Ambulatory Visit: Payer: PRIVATE HEALTH INSURANCE | Attending: Obstetrics and Gynecology

## 2020-01-05 ENCOUNTER — Encounter (HOSPITAL_COMMUNITY): Payer: Self-pay | Admitting: Obstetrics and Gynecology

## 2020-01-05 ENCOUNTER — Ambulatory Visit: Payer: PRIVATE HEALTH INSURANCE

## 2020-01-05 ENCOUNTER — Encounter (HOSPITAL_COMMUNITY)
Admission: AD | Disposition: A | Discharge status: Home / Self Care | Payer: Self-pay | Source: Home / Self Care | Attending: Obstetrics and Gynecology

## 2020-01-05 ENCOUNTER — Other Ambulatory Visit: Payer: Self-pay

## 2020-01-05 ENCOUNTER — Inpatient Hospital Stay (HOSPITAL_COMMUNITY)
Admission: AD | Admit: 2020-01-05 | Discharge: 2020-01-08 | DRG: 788 | Disposition: A | Discharge status: Home / Self Care | Payer: No Typology Code available for payment source | Attending: Obstetrics and Gynecology | Admitting: Obstetrics and Gynecology

## 2020-01-05 DIAGNOSIS — O3663X0 Maternal care for excessive fetal growth, third trimester, not applicable or unspecified: Secondary | ICD-10-CM | POA: Diagnosis present

## 2020-01-05 DIAGNOSIS — O1414 Severe pre-eclampsia complicating childbirth: Principal | ICD-10-CM | POA: Diagnosis present

## 2020-01-05 DIAGNOSIS — R030 Elevated blood-pressure reading, without diagnosis of hypertension: Secondary | ICD-10-CM | POA: Diagnosis not present

## 2020-01-05 DIAGNOSIS — O99824 Streptococcus B carrier state complicating childbirth: Secondary | ICD-10-CM | POA: Diagnosis present

## 2020-01-05 DIAGNOSIS — Z3A36 36 weeks gestation of pregnancy: Secondary | ICD-10-CM | POA: Diagnosis not present

## 2020-01-05 DIAGNOSIS — E039 Hypothyroidism, unspecified: Secondary | ICD-10-CM | POA: Diagnosis present

## 2020-01-05 DIAGNOSIS — O99284 Endocrine, nutritional and metabolic diseases complicating childbirth: Secondary | ICD-10-CM | POA: Diagnosis present

## 2020-01-05 DIAGNOSIS — F419 Anxiety disorder, unspecified: Secondary | ICD-10-CM | POA: Diagnosis present

## 2020-01-05 DIAGNOSIS — Z880 Allergy status to penicillin: Secondary | ICD-10-CM | POA: Diagnosis not present

## 2020-01-05 DIAGNOSIS — O1413 Severe pre-eclampsia, third trimester: Secondary | ICD-10-CM | POA: Diagnosis present

## 2020-01-05 DIAGNOSIS — Z20822 Contact with and (suspected) exposure to covid-19: Secondary | ICD-10-CM | POA: Diagnosis present

## 2020-01-05 DIAGNOSIS — O43123 Velamentous insertion of umbilical cord, third trimester: Secondary | ICD-10-CM | POA: Diagnosis present

## 2020-01-05 DIAGNOSIS — O99344 Other mental disorders complicating childbirth: Secondary | ICD-10-CM | POA: Diagnosis present

## 2020-01-05 DIAGNOSIS — O99891 Other specified diseases and conditions complicating pregnancy: Secondary | ICD-10-CM | POA: Diagnosis not present

## 2020-01-05 LAB — CBC
HCT: 31.7 % — ABNORMAL LOW (ref 36.0–46.0)
HCT: 29.8 % — ABNORMAL LOW (ref 36.0–46.0)
Hemoglobin: 10.1 g/dL — ABNORMAL LOW (ref 12.0–15.0)
Hemoglobin: 9.6 g/dL — ABNORMAL LOW (ref 12.0–15.0)
MCH: 29.9 pg (ref 26.0–34.0)
MCH: 30.2 pg (ref 26.0–34.0)
MCHC: 31.9 g/dL (ref 30.0–36.0)
MCHC: 32.2 g/dL (ref 30.0–36.0)
MCV: 93.8 fL (ref 80.0–100.0)
MCV: 93.7 fL (ref 80.0–100.0)
Platelets: 127 10*3/uL — ABNORMAL LOW (ref 150–400)
Platelets: 132 10*3/uL — ABNORMAL LOW (ref 150–400)
RBC: 3.38 MIL/uL — ABNORMAL LOW (ref 3.87–5.11)
RBC: 3.18 MIL/uL — ABNORMAL LOW (ref 3.87–5.11)
RDW: 12.8 % (ref 11.5–15.5)
RDW: 12.7 % (ref 11.5–15.5)
WBC: 8.5 10*3/uL (ref 4.0–10.5)
WBC: 11.8 10*3/uL — ABNORMAL HIGH (ref 4.0–10.5)
nRBC: 0 % (ref 0.0–0.2)
nRBC: 0 % (ref 0.0–0.2)

## 2020-01-05 LAB — TYPE AND SCREEN
ABO/RH(D): O POS
Antibody Screen: NEGATIVE

## 2020-01-05 LAB — COMPREHENSIVE METABOLIC PANEL
ALT: 17 U/L (ref 0–44)
AST: 26 U/L (ref 15–41)
Albumin: 2.7 g/dL — ABNORMAL LOW (ref 3.5–5.0)
Alkaline Phosphatase: 175 U/L — ABNORMAL HIGH (ref 38–126)
Anion gap: 10 (ref 5–15)
BUN: 6 mg/dL (ref 6–20)
CO2: 23 mmol/L (ref 22–32)
Calcium: 8.9 mg/dL (ref 8.9–10.3)
Chloride: 103 mmol/L (ref 98–111)
Creatinine, Ser: 0.8 mg/dL (ref 0.44–1.00)
GFR, Estimated: >60 mL/min (ref >60)
Glucose, Bld: 78 mg/dL (ref 70–99)
Potassium: 3.9 mmol/L (ref 3.5–5.1)
Sodium: 136 mmol/L (ref 135–145)
Total Bilirubin: 0.5 mg/dL (ref 0.3–1.2)
Total Protein: 5.3 g/dL — ABNORMAL LOW (ref 6.5–8.1)

## 2020-01-05 LAB — RESP PANEL BY RT-PCR (FLU A&B, COVID) ARPGX2
Influenza A by PCR: NEGATIVE
Influenza B by PCR: NEGATIVE
SARS Coronavirus 2 by RT PCR: NEGATIVE

## 2020-01-05 SURGERY — Surgical Case
Anesthesia: Epidural

## 2020-01-05 MED ORDER — SODIUM CHLORIDE 0.9 % IR SOLN
Status: DC | PRN
  Administered 2020-01-05: 1

## 2020-01-05 MED ORDER — TETANUS-DIPHTH-ACELL PERTUSSIS 5-2.5-18.5 LF-MCG/0.5 IM SUSY: 0.5000 mL | PREFILLED_SYRINGE | Freq: Once | INTRAMUSCULAR | Status: DC

## 2020-01-05 MED ORDER — PHENYLEPHRINE HCL-NACL 20-0.9 MG/250ML-% IV SOLN
INTRAVENOUS | Status: DC | PRN
  Administered 2020-01-05: 100 ug/min via INTRAVENOUS

## 2020-01-05 MED ORDER — OXYTOCIN BOLUS FROM INFUSION: 333.0000 mL | Freq: Once | INTRAVENOUS | Status: DC

## 2020-01-05 MED ORDER — FLEET ENEMA 7-19 GM/118ML RE ENEM: 1.0000 | ENEMA | RECTAL | Status: DC | PRN

## 2020-01-05 MED ORDER — ONDANSETRON HCL 4 MG/2ML IJ SOLN
INTRAMUSCULAR | Status: DC | PRN
  Administered 2020-01-05: 4 mg via INTRAVENOUS

## 2020-01-05 MED ORDER — TERBUTALINE SULFATE 1 MG/ML IJ SOLN: 0.2500 mg | Freq: Once | INTRAMUSCULAR | Status: DC | PRN

## 2020-01-05 MED ORDER — FENTANYL CITRATE (PF) 100 MCG/2ML IJ SOLN
INTRAMUSCULAR | Status: DC | PRN
Start: 2020-01-05 — End: 2020-01-05
  Administered 2020-01-05: 100 ug via EPIDURAL

## 2020-01-05 MED ORDER — HYDROMORPHONE HCL 1 MG/ML IJ SOLN: 0.2500 mg | INTRAMUSCULAR | Status: DC | PRN

## 2020-01-05 MED ORDER — FENTANYL CITRATE (PF) 100 MCG/2ML IJ SOLN
INTRAMUSCULAR | Status: DC | PRN
  Administered 2020-01-05: 100 ug via INTRAVENOUS
  Administered 2020-01-05: 50 ug via INTRAVENOUS

## 2020-01-05 MED ORDER — SIMETHICONE 80 MG PO CHEW
80.0000 mg | CHEWABLE_TABLET | Freq: Three times a day (TID) | ORAL | Status: DC
  Administered 2020-01-06 – 2020-01-08 (×7): 80 mg via ORAL
  Filled 2020-01-05 (×7): qty 1

## 2020-01-05 MED ORDER — KETOROLAC TROMETHAMINE 30 MG/ML IJ SOLN
30.0000 mg | Freq: Once | INTRAMUSCULAR | Status: AC | PRN
  Administered 2020-01-05: 30 mg via INTRAVENOUS

## 2020-01-05 MED ORDER — HYDROMORPHONE HCL 1 MG/ML IJ SOLN: 0.2000 mg | INTRAMUSCULAR | Status: DC | PRN

## 2020-01-05 MED ORDER — MORPHINE SULFATE (PF) 0.5 MG/ML IJ SOLN
INTRAMUSCULAR | Status: DC | PRN
  Administered 2020-01-05: 3 mg via EPIDURAL

## 2020-01-05 MED ORDER — ACETAMINOPHEN 500 MG PO TABS
1000.0000 mg | ORAL_TABLET | Freq: Four times a day (QID) | ORAL | Status: AC
  Administered 2020-01-05 – 2020-01-06 (×3): 1000 mg via ORAL
  Filled 2020-01-05 (×3): qty 2

## 2020-01-05 MED ORDER — SIMETHICONE 80 MG PO CHEW
80.0000 mg | CHEWABLE_TABLET | ORAL | Status: DC
  Administered 2020-01-06 – 2020-01-07 (×3): 80 mg via ORAL
  Filled 2020-01-05 (×3): qty 1

## 2020-01-05 MED ORDER — LEVOTHYROXINE SODIUM 25 MCG PO TABS
75.0000 ug | ORAL_TABLET | Freq: Every day | ORAL | Status: DC
  Filled 2020-01-05: qty 1

## 2020-01-05 MED ORDER — ACETAMINOPHEN 325 MG PO TABS: 650.0000 mg | ORAL_TABLET | ORAL | Status: DC | PRN

## 2020-01-05 MED ORDER — SODIUM CHLORIDE 0.9% FLUSH: 3.0000 mL | INTRAVENOUS | Status: DC | PRN

## 2020-01-05 MED ORDER — DIPHENHYDRAMINE HCL 25 MG PO CAPS
25.0000 mg | ORAL_CAPSULE | ORAL | Status: DC | PRN
  Filled 2020-01-05: qty 1

## 2020-01-05 MED ORDER — FENTANYL CITRATE (PF) 100 MCG/2ML IJ SOLN: 50.0000 ug | INTRAMUSCULAR | Status: DC | PRN

## 2020-01-05 MED ORDER — OXYTOCIN-SODIUM CHLORIDE 30-0.9 UT/500ML-% IV SOLN
INTRAVENOUS | Status: DC | PRN
  Administered 2020-01-05: 100 mL via INTRAVENOUS
  Administered 2020-01-05: 300 mL via INTRAVENOUS

## 2020-01-05 MED ORDER — OXYTOCIN-SODIUM CHLORIDE 30-0.9 UT/500ML-% IV SOLN
1.0000 m[IU]/min | INTRAVENOUS | Status: DC
  Administered 2020-01-05: 2 m[IU]/min via INTRAVENOUS
  Filled 2020-01-05: qty 500

## 2020-01-05 MED ORDER — FENTANYL-BUPIVACAINE-NACL 0.5-0.125-0.9 MG/250ML-% EP SOLN
12.0000 mL/h | EPIDURAL | Status: DC | PRN
  Filled 2020-01-05: qty 250

## 2020-01-05 MED ORDER — MEPERIDINE HCL 25 MG/ML IJ SOLN: 6.2500 mg | INTRAMUSCULAR | Status: DC | PRN

## 2020-01-05 MED ORDER — LACTATED RINGERS IV SOLN: 500.0000 mL | Freq: Once | INTRAVENOUS | Status: DC

## 2020-01-05 MED ORDER — KETOROLAC TROMETHAMINE 30 MG/ML IJ SOLN
INTRAMUSCULAR | Status: AC
  Filled 2020-01-05: qty 1

## 2020-01-05 MED ORDER — NALOXONE HCL 0.4 MG/ML IJ SOLN: 0.4000 mg | INTRAMUSCULAR | Status: DC | PRN

## 2020-01-05 MED ORDER — ONDANSETRON HCL 4 MG/2ML IJ SOLN: 4.0000 mg | Freq: Four times a day (QID) | INTRAMUSCULAR | Status: DC | PRN

## 2020-01-05 MED ORDER — VANCOMYCIN HCL IN DEXTROSE 1-5 GM/200ML-% IV SOLN
1000.0000 mg | Freq: Two times a day (BID) | INTRAVENOUS | Status: AC
  Administered 2020-01-06: 1000 mg via INTRAVENOUS
  Filled 2020-01-05: qty 200

## 2020-01-05 MED ORDER — OXYCODONE HCL 5 MG PO TABS
5.0000 mg | ORAL_TABLET | ORAL | Status: DC | PRN
  Administered 2020-01-07 – 2020-01-08 (×4): 5 mg via ORAL
  Filled 2020-01-05 (×4): qty 1

## 2020-01-05 MED ORDER — SOD CITRATE-CITRIC ACID 500-334 MG/5ML PO SOLN
30.0000 mL | ORAL | Status: DC | PRN
  Administered 2020-01-05: 30 mL via ORAL
  Filled 2020-01-05: qty 15

## 2020-01-05 MED ORDER — LACTATED RINGERS IV SOLN: 500.0000 mL | INTRAVENOUS | Status: DC | PRN

## 2020-01-05 MED ORDER — STERILE WATER FOR IRRIGATION IR SOLN: Status: DC | PRN

## 2020-01-05 MED ORDER — NALBUPHINE HCL 10 MG/ML IJ SOLN: 5.0000 mg | INTRAMUSCULAR | Status: DC | PRN

## 2020-01-05 MED ORDER — SCOPOLAMINE 1 MG/3DAYS TD PT72: 1.0000 | MEDICATED_PATCH | Freq: Once | TRANSDERMAL | Status: DC

## 2020-01-05 MED ORDER — SODIUM CHLORIDE (PF) 0.9 % IJ SOLN
INTRAMUSCULAR | Status: DC | PRN
  Administered 2020-01-05: 12 mL/h via EPIDURAL

## 2020-01-05 MED ORDER — OXYCODONE-ACETAMINOPHEN 5-325 MG PO TABS: 1.0000 | ORAL_TABLET | ORAL | Status: DC | PRN

## 2020-01-05 MED ORDER — LEVOTHYROXINE SODIUM 25 MCG PO TABS
75.0000 ug | ORAL_TABLET | Freq: Every day | ORAL | Status: DC
  Administered 2020-01-06 – 2020-01-08 (×3): 75 ug via ORAL
  Filled 2020-01-05 (×2): qty 3

## 2020-01-05 MED ORDER — LIDOCAINE HCL (PF) 1 % IJ SOLN
INTRAMUSCULAR | Status: DC | PRN
  Administered 2020-01-05: 5 mL via EPIDURAL

## 2020-01-05 MED ORDER — MAGNESIUM SULFATE 40 GM/1000ML IV SOLN: 1.0000 g/h | INTRAVENOUS | Status: DC

## 2020-01-05 MED ORDER — OXYCODONE-ACETAMINOPHEN 5-325 MG PO TABS: 2.0000 | ORAL_TABLET | ORAL | Status: DC | PRN

## 2020-01-05 MED ORDER — LIDOCAINE-EPINEPHRINE (PF) 2 %-1:200000 IJ SOLN
INTRAMUSCULAR | Status: DC | PRN
  Administered 2020-01-05: 10 mL via EPIDURAL

## 2020-01-05 MED ORDER — LACTATED RINGERS IV SOLN: INTRAVENOUS | Status: DC

## 2020-01-05 MED ORDER — OXYCODONE HCL 5 MG/5ML PO SOLN: 5.0000 mg | Freq: Once | ORAL | Status: DC | PRN

## 2020-01-05 MED ORDER — OXYTOCIN-SODIUM CHLORIDE 30-0.9 UT/500ML-% IV SOLN: 2.5000 [IU]/h | INTRAVENOUS | Status: AC

## 2020-01-05 MED ORDER — ONDANSETRON HCL 4 MG/2ML IJ SOLN: 4.0000 mg | Freq: Three times a day (TID) | INTRAMUSCULAR | Status: DC | PRN

## 2020-01-05 MED ORDER — PHENYLEPHRINE 40 MCG/ML (10ML) SYRINGE FOR IV PUSH (FOR BLOOD PRESSURE SUPPORT)
80.0000 ug | PREFILLED_SYRINGE | INTRAVENOUS | Status: DC | PRN
  Administered 2020-01-05 (×2): 80 ug via INTRAVENOUS

## 2020-01-05 MED ORDER — COCONUT OIL OIL
1.0000 "application " | TOPICAL_OIL | Status: DC | PRN
  Administered 2020-01-06: 1 via TOPICAL

## 2020-01-05 MED ORDER — NALBUPHINE HCL 10 MG/ML IJ SOLN: 5.0000 mg | Freq: Once | INTRAMUSCULAR | Status: DC | PRN

## 2020-01-05 MED ORDER — NALOXONE HCL 4 MG/10ML IJ SOLN
1.0000 ug/kg/h | INTRAVENOUS | Status: DC | PRN
  Filled 2020-01-05: qty 5

## 2020-01-05 MED ORDER — KETOROLAC TROMETHAMINE 30 MG/ML IJ SOLN: 30.0000 mg | Freq: Four times a day (QID) | INTRAMUSCULAR | Status: DC | PRN

## 2020-01-05 MED ORDER — DEXAMETHASONE SODIUM PHOSPHATE 10 MG/ML IJ SOLN
INTRAMUSCULAR | Status: DC | PRN
  Administered 2020-01-05: 10 mg via INTRAVENOUS

## 2020-01-05 MED ORDER — DIPHENHYDRAMINE HCL 25 MG PO CAPS
25.0000 mg | ORAL_CAPSULE | Freq: Four times a day (QID) | ORAL | Status: DC | PRN
  Administered 2020-01-06: 25 mg via ORAL

## 2020-01-05 MED ORDER — ZOLPIDEM TARTRATE 5 MG PO TABS: 5.0000 mg | ORAL_TABLET | Freq: Every evening | ORAL | Status: DC | PRN

## 2020-01-05 MED ORDER — VANCOMYCIN HCL IN DEXTROSE 1-5 GM/200ML-% IV SOLN
1000.0000 mg | Freq: Two times a day (BID) | INTRAVENOUS | Status: DC
  Administered 2020-01-05: 1000 mg via INTRAVENOUS
  Filled 2020-01-05: qty 200

## 2020-01-05 MED ORDER — PHENYLEPHRINE 40 MCG/ML (10ML) SYRINGE FOR IV PUSH (FOR BLOOD PRESSURE SUPPORT)
80.0000 ug | PREFILLED_SYRINGE | INTRAVENOUS | Status: AC | PRN
  Administered 2020-01-05 (×4): 80 ug via INTRAVENOUS
  Filled 2020-01-05 (×2): qty 10

## 2020-01-05 MED ORDER — MAGNESIUM SULFATE BOLUS VIA INFUSION
4.0000 g | Freq: Once | INTRAVENOUS | Status: AC
  Administered 2020-01-05: 4 g via INTRAVENOUS
  Filled 2020-01-05: qty 1000

## 2020-01-05 MED ORDER — PRENATAL MULTIVITAMIN CH
1.0000 | ORAL_TABLET | Freq: Every day | ORAL | Status: DC
  Administered 2020-01-06 – 2020-01-07 (×2): 1 via ORAL
  Filled 2020-01-05 (×2): qty 1

## 2020-01-05 MED ORDER — PHENYLEPHRINE HCL (PRESSORS) 10 MG/ML IV SOLN
INTRAVENOUS | Status: DC | PRN
  Administered 2020-01-05: 80 ug via INTRAVENOUS
  Administered 2020-01-05: 120 ug via INTRAVENOUS

## 2020-01-05 MED ORDER — EPHEDRINE 5 MG/ML INJ: 10.0000 mg | INTRAVENOUS | Status: DC | PRN

## 2020-01-05 MED ORDER — SIMETHICONE 80 MG PO CHEW: 80.0000 mg | CHEWABLE_TABLET | ORAL | Status: DC | PRN

## 2020-01-05 MED ORDER — PROMETHAZINE HCL 25 MG/ML IJ SOLN: 6.2500 mg | INTRAMUSCULAR | Status: DC | PRN

## 2020-01-05 MED ORDER — SENNOSIDES-DOCUSATE SODIUM 8.6-50 MG PO TABS
2.0000 | ORAL_TABLET | ORAL | Status: DC
  Administered 2020-01-06 – 2020-01-07 (×2): 2 via ORAL
  Filled 2020-01-05 (×3): qty 2

## 2020-01-05 MED ORDER — SERTRALINE HCL 50 MG PO TABS
50.0000 mg | ORAL_TABLET | Freq: Every day | ORAL | Status: DC
  Administered 2020-01-06 – 2020-01-07 (×3): 50 mg via ORAL
  Filled 2020-01-05 (×3): qty 1

## 2020-01-05 MED ORDER — SERTRALINE HCL 50 MG PO TABS
50.0000 mg | ORAL_TABLET | Freq: Every day | ORAL | Status: DC
  Filled 2020-01-05: qty 1

## 2020-01-05 MED ORDER — OXYTOCIN-SODIUM CHLORIDE 30-0.9 UT/500ML-% IV SOLN: 2.5000 [IU]/h | INTRAVENOUS | Status: DC

## 2020-01-05 MED ORDER — LIDOCAINE HCL (PF) 1 % IJ SOLN: 30.0000 mL | INTRAMUSCULAR | Status: DC | PRN

## 2020-01-05 MED ORDER — MENTHOL 3 MG MT LOZG: 1.0000 | LOZENGE | OROMUCOSAL | Status: DC | PRN

## 2020-01-05 MED ORDER — MAGNESIUM SULFATE 40 GM/1000ML IV SOLN
2.0000 g/h | INTRAVENOUS | Status: DC
  Filled 2020-01-05: qty 1000

## 2020-01-05 MED ORDER — LACTATED RINGERS IV SOLN: INTRAVENOUS | Status: DC | PRN

## 2020-01-05 MED ORDER — DIPHENHYDRAMINE HCL 50 MG/ML IJ SOLN
12.5000 mg | INTRAMUSCULAR | Status: DC | PRN
  Administered 2020-01-05: 12.5 mg via INTRAVENOUS
  Filled 2020-01-05: qty 1

## 2020-01-05 MED ORDER — OXYCODONE HCL 5 MG PO TABS: 5.0000 mg | ORAL_TABLET | Freq: Once | ORAL | Status: DC | PRN

## 2020-01-05 MED ORDER — DIPHENHYDRAMINE HCL 50 MG/ML IJ SOLN: 12.5000 mg | INTRAMUSCULAR | Status: DC | PRN

## 2020-01-05 MED ORDER — ACETAMINOPHEN 325 MG PO TABS
650.0000 mg | ORAL_TABLET | ORAL | Status: DC | PRN
  Administered 2020-01-06 – 2020-01-07 (×2): 650 mg via ORAL
  Filled 2020-01-05 (×2): qty 2

## 2020-01-05 SURGICAL SUPPLY — 36 items
BENZOIN TINCTURE PRP APPL 2/3 (GAUZE/BANDAGES/DRESSINGS) ×3 IMPLANT
CHLORAPREP W/TINT 26ML (MISCELLANEOUS) ×3 IMPLANT
CLAMP CORD UMBIL (MISCELLANEOUS) IMPLANT
CLOSURE WOUND 1/2 X4 (GAUZE/BANDAGES/DRESSINGS) ×1
CLOTH BEACON ORANGE TIMEOUT ST (SAFETY) ×3 IMPLANT
DERMABOND ADVANCED (GAUZE/BANDAGES/DRESSINGS)
DERMABOND ADVANCED .7 DNX12 (GAUZE/BANDAGES/DRESSINGS) IMPLANT
DRSG OPSITE POSTOP 4X10 (GAUZE/BANDAGES/DRESSINGS) ×3 IMPLANT
ELECT REM PT RETURN 9FT ADLT (ELECTROSURGICAL) ×3
ELECTRODE REM PT RTRN 9FT ADLT (ELECTROSURGICAL) ×1 IMPLANT
EXTRACTOR VACUUM M CUP 4 TUBE (SUCTIONS) ×2 IMPLANT
EXTRACTOR VACUUM M CUP 4' TUBE (SUCTIONS) ×1
GAUZE SPONGE 4X4 12PLY STRL LF (GAUZE/BANDAGES/DRESSINGS) ×6 IMPLANT
GLOVE BIO SURGEON STRL SZ7.5 (GLOVE) ×3 IMPLANT
GLOVE BIOGEL PI IND STRL 7.0 (GLOVE) ×1 IMPLANT
GLOVE BIOGEL PI INDICATOR 7.0 (GLOVE) ×2
GOWN STRL REUS W/TWL LRG LVL3 (GOWN DISPOSABLE) ×6 IMPLANT
KIT ABG SYR 3ML LUER SLIP (SYRINGE) ×3 IMPLANT
NEEDLE HYPO 25X5/8 SAFETYGLIDE (NEEDLE) ×3 IMPLANT
NS IRRIG 1000ML POUR BTL (IV SOLUTION) ×3 IMPLANT
PACK C SECTION WH (CUSTOM PROCEDURE TRAY) ×3 IMPLANT
PAD ABD 7.5X8 STRL (GAUZE/BANDAGES/DRESSINGS) ×3 IMPLANT
PAD OB MATERNITY 4.3X12.25 (PERSONAL CARE ITEMS) ×3 IMPLANT
PENCIL SMOKE EVAC W/HOLSTER (ELECTROSURGICAL) ×3 IMPLANT
STRIP CLOSURE SKIN 1/2X4 (GAUZE/BANDAGES/DRESSINGS) ×2 IMPLANT
SUT MNCRL 0 VIOLET CTX 36 (SUTURE) ×4 IMPLANT
SUT MONOCRYL 0 CTX 36 (SUTURE) ×12
SUT PDS AB 0 CTX 60 (SUTURE) ×3 IMPLANT
SUT PLAIN 0 NONE (SUTURE) IMPLANT
SUT PLAIN 2 0 (SUTURE) ×3
SUT PLAIN 2 0 XLH (SUTURE) IMPLANT
SUT PLAIN ABS 2-0 CT1 27XMFL (SUTURE) ×1 IMPLANT
SUT VIC AB 4-0 KS 27 (SUTURE) ×3 IMPLANT
TOWEL OR 17X24 6PK STRL BLUE (TOWEL DISPOSABLE) ×3 IMPLANT
TRAY FOLEY W/BAG SLVR 14FR LF (SET/KITS/TRAYS/PACK) ×3 IMPLANT
WATER STERILE IRR 1000ML POUR (IV SOLUTION) ×6 IMPLANT

## 2020-01-05 NOTE — Anesthesia Postprocedure Evaluation (Signed)
Anesthesia Post Note  Patient: Annette Harris  Procedure(s) Performed: CESAREAN SECTION (N/A )     Patient location during evaluation: PACU Anesthesia Type: Epidural Level of consciousness: oriented and awake and alert Pain management: pain level controlled Vital Signs Assessment: post-procedure vital signs reviewed and stable Respiratory status: spontaneous breathing and respiratory function stable Cardiovascular status: blood pressure returned to baseline and stable Postop Assessment: no headache, no backache, no apparent nausea or vomiting, patient able to bend at knees and epidural receding Anesthetic complications: no   No complications documented.  Last Vitals:  Vitals:   01/05/20 2051 01/05/20 2052  BP:    Pulse: 73 75  Resp: 20 15  Temp:    SpO2: 93% 94%    Last Pain:  Vitals:   01/05/20 1701  TempSrc: Axillary   Pain Goal:                   [name]

## 2020-01-05 NOTE — Progress Notes (Signed)
Cx was rechecked at 4/90/unable to feel presenting part. BSUS done again>Vtx. Plan is to continue pitocin She had spontaneous ROM for clear fluid. FHT were cat one. Following that she felt something in vagina. Nurse checked and felt a loop of prolapsed cord through the cervix. She kept her examining hand in place and could not feel presenting part. She could feel pulsations of cord > 100. I responded immediately from the other end of L&D and arrived to note FHT >100 and nurse in bed with patient to protect cord. Emergent C/S called and the patient is moved to OR.

## 2020-01-05 NOTE — Lactation Note (Signed)
This note was copied from a baby's chart. Lactation Consultation Note Baby 4 hrs old. Mom's 2nd child. Mom's 1st child is 38 yrs old. When LC entered rm. FOB assisting mom in latching. Praised parents. Mom slightly leaning on her  Side BF baby. LC widened flange and placed folded baby blanket under her arm to bring baby closer for deeper latch. Encouraged cheeks to breast. Mom has cone shaped breast w/bulbous areolas. Very short shaft compressible nipples. Semi flat. Everts well w/stimulation. Hand expression demonstrated easy flow of colostrum. Collected 5 ml for supplementing baby after BF.  LPI information sheet given and reviewed to mom. Baby has great weight for LPI. Mom in agreement for supplementing after BF.   Baby BF well, heard swallows. Breast massage encouraged at intervals. Mom falling asleep at intervals. Noted baby had periodical grunting while at the breast. Not increased respirations noted.  FOB gave baby colostrum in bottle w/yellow slow flow nipple. Before feeding supplement, baby had large mucous emesis w/appearance of yellow colostrum.  Mom encouraged to feed baby 8-12 times/24 hours and with feeding cues. Mom shown how to use DEBP & how to disassemble, clean, & reassemble parts. Mom knows to pump q3h for 15-20 min. LC will f/u w/mom on feeding and will get mom pumping after she rest some. Mom very sleepy.  LPI newborn behavior, STS, I&O, breast massage, supply and demand. Lactation brochure given. Encouraged to call for assistance. LC will f/u again this shift.  Patient Name: Annette Harris DVVOH'Y Date: 01/05/2020 Reason for consult: Initial assessment;Late-preterm 34-36.6wks;Maternal endocrine disorder Type of Endocrine Disorder?: Thyroid   Maternal Data Has patient been taught Hand Expression?: Yes  Feeding Feeding Type: Breast Milk  LATCH Score Latch: Grasps breast easily, tongue down, lips flanged, rhythmical sucking.  Audible Swallowing: A few  with stimulation  Type of Nipple: Everted at rest and after stimulation (short shaft)  Comfort (Breast/Nipple): Soft / non-tender  Hold (Positioning): Assistance needed to correctly position infant at breast and maintain latch.  LATCH Score: 8  Interventions Interventions: Breast feeding basics reviewed;Adjust position;DEBP;Assisted with latch;Support pillows;Skin to skin;Position options;Breast massage;Expressed milk;Hand express;Pre-pump if needed;Breast compression  Lactation Tools Discussed/Used Tools: Pump Breast pump type: Double-Electric Breast Pump Pump Review: Milk Storage;Setup, frequency, and cleaning Initiated by:: Peri Jefferson RN IBCLC Date initiated:: 01/05/20   Consult Status Consult Status: Follow-up Date: 01/06/20 Follow-up type: In-patient    Charyl Dancer 01/05/2020, 11:35 PM

## 2020-01-05 NOTE — Anesthesia Preprocedure Evaluation (Addendum)
Anesthesia Evaluation  Patient identified by MRN, date of birth, ID band Patient awake    Reviewed: Allergy & Precautions, NPO status , Patient's Chart, lab work & pertinent test results  Airway Mallampati: II  TM Distance: >3 FB Neck ROM: Full    Dental no notable dental hx. (+) Teeth Intact, Dental Advisory Given   Pulmonary neg pulmonary ROS,    Pulmonary exam normal breath sounds clear to auscultation       Cardiovascular hypertension, Pt. on medications Normal cardiovascular exam Rhythm:Regular Rate:Normal  Severe Pre E on MG++   Neuro/Psych  Neuromuscular disease    GI/Hepatic negative GI ROS, Neg liver ROS,   Endo/Other  Hypothyroidism   Renal/GU negative Renal ROS     Musculoskeletal negative musculoskeletal ROS (+)   Abdominal   Peds  Hematology Lab Results      Component                Value               Date                      WBC                      8.5                 01/05/2020                HGB                      10.1 (L)            01/05/2020                HCT                      31.7 (L)            01/05/2020                [name]                      93.8                01/05/2020                PLT                      127 (L)             01/05/2020              Anesthesia Other Findings   Reproductive/Obstetrics (+) Pregnancy                            Anesthesia Physical Anesthesia Plan  ASA: III and emergent  Anesthesia Plan: Epidural   Post-op Pain Management:    Induction:   PONV Risk Score and Plan: Treatment may vary due to age or medical condition  Airway Management Planned:   Additional Equipment:   Intra-op Plan:   Post-operative Plan:   Informed Consent: I have reviewed the patients History and Physical, chart, labs and discussed the procedure including the risks, benefits and alternatives for the proposed anesthesia with the patient or  authorized representative who has indicated his/her understanding and acceptance.       Plan  Discussed with:   Anesthesia Plan Comments: (36.6 wk G2P1 w Severe Pre E on Mg for LEA Will re Check plts   Stat c section for cord prolapse, epidural functioning well )      Anesthesia Quick Evaluation

## 2020-01-05 NOTE — Progress Notes (Signed)
01/05/2020  7:28 PM  PATIENT:  Annette Harris  39 y.o. female  PRE-OPERATIVE DIAGNOSIS: Prolapsed umbilical cord  POST-OPERATIVE DIAGNOSIS: Prolapsed umbilical cord  PROCEDURE:  Procedure(s): CESAREAN SECTION (N/A)  SURGEON:  Surgeon(s) and Role:    * Harold Hedge, MD - Primary  PHYSICIAN ASSISTANT:   ASSISTANTS: none   ANESTHESIA:   epidural  EBL:  246 ml   BLOOD ADMINISTERED:none  DRAINS: Urinary Catheter (Foley)   LOCAL MEDICATIONS USED:  NONE  SPECIMEN:  Source of Specimen:  placenta  DISPOSITION OF SPECIMEN:  PATHOLOGY  COUNTS:  YES  TOURNIQUET:  * No tourniquets in log *  DICTATION: .Other Dictation: Dictation Number [id number]  PLAN OF CARE: Admit to inpatient   PATIENT DISPOSITION:  PACU - hemodynamically stable.   Delay start of Pharmacological VTE agent (>24hrs) due to surgical blood loss or risk of bleeding: not applicable

## 2020-01-05 NOTE — Progress Notes (Signed)
Patient had hypotension following epidural placement. Was treated with phenylephrine x 4 doses with Dr Richardson Landry. She also received IV fluid bolus and BP responded well. There was a fetal deceleration with the hypotension that responded well to above measures.  After about 45-60 minutes I returned to room to check cervix. Patient was supine and nurse was placing foley catheter. Patient became hypotensive with BP 60s sys. Treated with more IV fluids, position change and phenylephrine x 1 dose.  She now feels fine on her side.   Today's Vitals   01/05/20 1630 01/05/20 1631 01/05/20 1635 01/05/20 1640  BP:  (!) 95/53    Pulse:  74    Temp:      SpO2: 100%  99% 99%   There is no height or weight on file to calculate BMI.   FHT cat one UCs q2-4 min Cx 4/90/ballotable-checked with patient on her left hip BSUS>VTX DTR 3+ without clonus UO dilute  A/P: will continue pitocin and magnesium sulfate         AROM when vtx is lower in the pelvis

## 2020-01-05 NOTE — Anesthesia Procedure Notes (Addendum)
Epidural Patient location during procedure: OB Start time: 01/05/2020 3:15 PM End time: 01/05/2020 3:31 PM  Staffing Anesthesiologist: Trevor Iha, MD Performed: anesthesiologist   Preanesthetic Checklist Completed: patient identified, IV checked, site marked, risks and benefits discussed, surgical consent, monitors and equipment checked, pre-op evaluation and timeout performed  Epidural Patient position: sitting Prep: DuraPrep and site prepped and draped Patient monitoring: continuous pulse ox and blood pressure Approach: midline Location: L3-L4 Injection technique: LOR air  Needle:  Needle type: Tuohy  Needle gauge: 17 G Needle length: 9 cm and 9 Needle insertion depth: 7 cm Catheter type: closed end flexible Catheter size: 19 Gauge Catheter at skin depth: 12 cm Test dose: negative  Assessment Events: blood not aspirated, injection not painful, no injection resistance, no paresthesia and negative IV test  Additional Notes Patient identified. Risks/Benefits/Options discussed with patient including but not limited to bleeding, infection, nerve damage, paralysis, failed block, incomplete pain control, headache, blood pressure changes, nausea, vomiting, reactions to medication both or allergic, itching and postpartum back pain. Confirmed with bedside nurse the patient's most recent platelet count. Confirmed with patient that they are not currently taking any anticoagulation, have any bleeding history or any family history of bleeding disorders. Patient expressed understanding and wished to proceed. All questions were answered. Sterile technique was used throughout the entire procedure. Please see nursing notes for vital signs. Test dose was given through epidural needle and negative prior to continuing to dose epidural or start infusion. Warning signs of high block given to the patient including shortness of breath, tingling/numbness in hands, complete motor block, or any  concerning symptoms with instructions to call for help. Patient was given instructions on fall risk and not to get out of bed. All questions and concerns addressed with instructions to call with any issues.1  Attempt (S) . Patient tolerated procedure well.

## 2020-01-05 NOTE — Transfer of Care (Signed)
Immediate Anesthesia Transfer of Care Note  Patient: Annette Harris  Procedure(s) Performed: CESAREAN SECTION (N/A )  Patient Location: PACU  Anesthesia Type:Epidural  Level of Consciousness: awake, alert  and oriented  Airway & Oxygen Therapy: Patient Spontanous Breathing  Post-op Assessment: Report given to RN and Post -op Vital signs reviewed and stable  Post vital signs: Reviewed and stable HR 70, RR 17, SaO2 99%, BP 101/43  Last Vitals:  Vitals Value Taken Time  BP    Temp    Pulse    Resp    SpO2      Last Pain:  Vitals:   01/05/20 1701  TempSrc: Axillary         Complications: No complications documented.

## 2020-01-05 NOTE — H&P (Signed)
Annette Harris is a 39 y.o. female presenting for HA, some RUQ pain, BP in office 168/104 and 1+ ptn. Evaluated yesterday in MAU after BP in office of 150/90. Labs were normal with exception of P:C of .4. She was sent home for FU today in office. Today C/O HA, RUQ pain and more swelling. Pregnancy complicated by hypothyroidism on replacement, OC disorder on sertraline, Hx of PTD @ 23.6 weeks>Makena, AMA>Panorama normal, velamentous insertion>MFM U/S notes no vasa previa and vaginal delivery OK, polyhydramnios>resolved, LGA with 90%, 6# 11oz, @ 35 wks, IVF pregnancy. OB History    Gravida  2   Para  1   Term      Preterm  1   AB      Living  1     SAB      TAB      Ectopic      Multiple      Live Births  1          Past Medical History:  Diagnosis Date  . Anemia   . Anxiety   . Dermatomyositis (HCC)   . Dermatomyositis (HCC)   . Endometriosis   . Hypothyroidism   . Lymphocytic colitis   . Microscopic colitis   . Preterm labor   . Thyroid disease    Past Surgical History:  Procedure Laterality Date  . laproscopy for endometrosis  2011  . MUSCLE BIOPSY    . TONSILLECTOMY AND ADENOIDECTOMY  1986   Family History: family history includes Hyperlipidemia in her father; Hypertension in her father. Social History:  reports that she has never smoked. She has never used smokeless tobacco. She reports that she does not drink alcohol and does not use drugs.     Maternal Diabetes: No Genetic Screening: Normal Maternal Ultrasounds/Referrals: Other: polyhydramnios>resolved, velamentous insertion of cord Fetal Ultrasounds or other Referrals:  Referred to Materal Fetal Medicine  Maternal Substance Abuse:  No Significant Maternal Medications:  Meds include: Zoloft Significant Maternal Lab Results:  Group B Strep positive Other Comments:  None  Review of Systems  Eyes: Negative for photophobia.  Gastrointestinal: Positive for abdominal pain.       RUQ pain   Neurological: Positive for headaches.   Maternal Medical History:  Fetal activity: Perceived fetal activity is normal.        Temperature 98.2 F (36.8 C), last menstrual period 01/03/2019. Maternal Exam:  Abdomen: Fetal presentation: vertex     Physical Exam Cardiovascular:     Rate and Rhythm: Normal rate.  Pulmonary:     Effort: Pulmonary effort is normal.   Cx 3/70/-3 on office exam today   Prenatal labs: ABO, Rh:   Antibody:   Rubella:   RPR:    HBsAg:    HIV:    GBS:     Assessment/Plan: 39 yo G2P1 @ 36 6/7 Severe preeclampsia by severe range BP and HA Recommend delivery>pitocin Magnesium sulfate for Sz prophylaxis Vancomycin for Clinda resistant GBBS with PCN allergy Labs ordered   Roselle Locus II 01/05/2020, 12:25 PM

## 2020-01-06 ENCOUNTER — Encounter (HOSPITAL_COMMUNITY): Payer: Self-pay | Admitting: Obstetrics and Gynecology

## 2020-01-06 LAB — COMPREHENSIVE METABOLIC PANEL
ALT: 18 U/L (ref 0–44)
AST: 31 U/L (ref 15–41)
Albumin: 2.4 g/dL — ABNORMAL LOW (ref 3.5–5.0)
Alkaline Phosphatase: 165 U/L — ABNORMAL HIGH (ref 38–126)
Anion gap: 9 (ref 5–15)
BUN: 5 mg/dL — ABNORMAL LOW (ref 6–20)
CO2: 23 mmol/L (ref 22–32)
Calcium: 7.7 mg/dL — ABNORMAL LOW (ref 8.9–10.3)
Chloride: 103 mmol/L (ref 98–111)
Creatinine, Ser: 0.76 mg/dL (ref 0.44–1.00)
GFR, Estimated: >60 mL/min (ref >60)
Glucose, Bld: 132 mg/dL — ABNORMAL HIGH (ref 70–99)
Potassium: 4.1 mmol/L (ref 3.5–5.1)
Sodium: 135 mmol/L (ref 135–145)
Total Bilirubin: 0.4 mg/dL (ref 0.3–1.2)
Total Protein: 5 g/dL — ABNORMAL LOW (ref 6.5–8.1)

## 2020-01-06 LAB — CBC
HCT: 29.5 % — ABNORMAL LOW (ref 36.0–46.0)
Hemoglobin: 10.2 g/dL — ABNORMAL LOW (ref 12.0–15.0)
MCH: 31.3 pg (ref 26.0–34.0)
MCHC: 34.6 g/dL (ref 30.0–36.0)
MCV: 90.5 fL (ref 80.0–100.0)
Platelets: 122 10*3/uL — ABNORMAL LOW (ref 150–400)
RBC: 3.26 MIL/uL — ABNORMAL LOW (ref 3.87–5.11)
RDW: 12.4 % (ref 11.5–15.5)
WBC: 17 10*3/uL — ABNORMAL HIGH (ref 4.0–10.5)
nRBC: 0 % (ref 0.0–0.2)

## 2020-01-06 LAB — MAGNESIUM: Magnesium: 5 mg/dL — ABNORMAL HIGH (ref 1.7–2.4)

## 2020-01-06 LAB — RPR: RPR Ser Ql: NONREACTIVE

## 2020-01-06 MED ORDER — IBUPROFEN 600 MG PO TABS
600.0000 mg | ORAL_TABLET | Freq: Four times a day (QID) | ORAL | Status: DC | PRN
  Administered 2020-01-06 – 2020-01-07 (×3): 600 mg via ORAL
  Filled 2020-01-06 (×3): qty 1

## 2020-01-06 NOTE — Lactation Note (Signed)
This note was copied from a baby's chart. Lactation Consultation Note  Patient Name: Boy Estephany Perot [id number] Date: 01/06/2020 Reason for consult: Follow-up assessment;Late-preterm 34-36.6wks Type of Endocrine Disorder?: Thyroid  0951 - 1008 - I followed up with Ms. Metsker. She was breast feeding her son Joice Lofts Morton Peters) upon entry. He was laid across her body (she was reclined quite a bit) in cradle hold on the right breast. She states that he had been latched for about 15 minutes. He popped off the breast while we were speaking. I encouraged her to offer the other breast now, and we moved him to the left breast.  We reviewed hand expression, and she expressed drops of colostrum onto the nipple. Bo licked and rooted and eventually latched himself. I observed rhythmic suckling sequences. I showed her how to gently "pester" him to keep him active at the breast and how to do breast compressions without pulling back on the breast tissue.  I encouraged her to breast feed 8-12 times a day on demand and to offer both breasts in a feeding. I also encouraged her to continue pump[ing some today, as able, for additional stimulation. Ms. Faraci is still quite tired and on magnesium therapy. Baby is now 78 hours old.  Given baby's borderline LPI status and Ms. Soliman's hypertension, I recommended that an LC follow up later today to check on her progress and status. We discussed possibility of supplementation, if needed. She stated that she would like to be seen again tonight.   Maternal Data Formula Feeding for Exclusion: No Has patient been taught Hand Expression?: Yes Does the patient have breastfeeding experience prior to this delivery?: Yes  Feeding Feeding Type: Breast Fed  LATCH Score Latch: Grasps breast easily, tongue down, lips flanged, rhythmical sucking.  Audible Swallowing: A few with stimulation  Type of Nipple: Everted at rest and after stimulation  Comfort (Breast/Nipple): Soft /  non-tender  Hold (Positioning): No assistance needed to correctly position infant at breast.  LATCH Score: 9  Interventions Interventions: Breast feeding basics reviewed;Breast massage;Hand express;Adjust position;Breast compression  Lactation Tools Discussed/Used Pump Review: Setup, frequency, and cleaning   Consult Status Consult Status: Follow-up Date: 01/06/20 Follow-up type: In-patient    Walker Shadow 01/06/2020, 10:13 AM

## 2020-01-06 NOTE — Op Note (Signed)
NAME: Annette, Harris MEDICAL RECORD WU:[id number] ACCOUNT [id number] DATE OF BIRTH:[birth year] FACILITY: [facility] LOCATION: [facility] PHYSICIAN:Jabir Dahlem E. Marialuiza Car II, MD  OPERATIVE REPORT  DATE OF PROCEDURE:  01/05/2020  PREOPERATIVE DIAGNOSIS:  Prolapsed umbilical cord.  POSTOPERATIVE DIAGNOSIS:  Prolapsed umbilical cord.  PROCEDURE:  Emergent low transverse cesarean section.  SURGEON:  Harold Hedge, MD  ANESTHESIA:  Epidural.  ESTIMATED BLOOD LOSS:  245 mL.  SPECIMENS:  Placenta to pathology.  FINDINGS:  Viable female infant.  Apgars, arterial cord pH, and birth weight pending.  INDICATIONS AND CONSENT:  This patient is a 39 year old, G2, P1, at 36-6/7 weeks who was admitted for induction of labor for preeclampsia with severe features.  She is on magnesium sulfate for seizure prophylaxis.  She is PENICILLIN allergic and group B  strep positive with clindamycin resistance.  She has received vancomycin.  Cervix is 4 cm dilation, 90% effacement with a very ballottable presenting part.  She then had spontaneous rupture of membranes and following that noted a popping sensation in the  vagina.  The nurse immediately checked her and noted a prolapsed umbilical cord.  She could still not feel a presenting part.  She remained in place to protect the cord and pulsations on the cord, as well as the fetal heart tracing on external monitor  remained over 100.  Immediate emergent cesarean section is called.  DESCRIPTION OF PROCEDURE:  The patient was taken to the operating room in an emergent fashion.  She was transferred to the operating table and the nurse maintained an examining hand in the vagina to protect the cord.  Pulsations were still over 100.  She  was prepped with a splash of Betadine.  Foley catheter was already in place.  She had received her vancomycin previously for group B beta strep prophylaxis.  After testing for adequate epidural anesthetic, which had been augmented to a surgical  level,  skin was entered through a Pfannenstiel incision and dissection was carried down to the fascia.  The fascia was taken down bilaterally.  Dissection was carried down to the peritoneum, which was bluntly entered and extended bluntly.  Vesicouterine  peritoneum was taken down.  The uterus was incised in a low transverse manner, and the uterine cavity was entered bluntly with a hemostat.  The uterine incision was extended bilaterally with fingers.  Vertex was then delivered into the incision, and a  vacuum extractor was used to elevate the vertex through the incision.  The baby was then delivered, and good cry and tone were noted.  After 1 minute waiting time, the cord was clamped and cut.  The baby was handed to waiting pediatrics team.  Arterial  cord gas and blood were drawn.  Placenta was delivered intact and sent to pathology.  Uterine cavity was cleaned.  Uterus was closed in a running locking fashion with 2 imbricating layers of 0 Monocryl suture.  Additional suture of figure-of-eight was  placed in the center to obtain complete hemostasis.  Lavage was carried out.  The anterior peritoneum was closed in a running fashion with 0 Monocryl suture, which was also used to reapproximate the pyramidalis muscle in the midline.  Anterior rectus  fascia was closed in a running fashion with a 0 looped PDS.  Subcutaneous layer was closed with interrupted 2-0 plain, and the skin was closed in a subcuticular fashion with 4-0 Vicryl on a Keith needle.  Benzoin, Steri-Strips, honeycomb, and pressure  dressings were applied.  All counts were correct.  The patient  was taken to the recovery room in stable condition.  IN/NUANCE  D:01/05/2020 T:01/06/2020 JOB:[id number]

## 2020-01-06 NOTE — Progress Notes (Signed)
Postpartum Progress Note  Postpartum Day 1 s/p primary Cesarean section, emergent for cord prolapse. Pregnancy also complicated by preeclampsia with severe range BP.  She is currently on magnesium infusion.  Subjective:  Overnight, patient reported feeling flushed, lightheaded with magnesium. Otherwise no acute events. She reports well controlled pain, she has not ambulated, foley is in place. Vaginal bleeding is minimal.  Objective: Blood pressure 110/70, pulse 71, temperature 97.7 F (36.5 C), temperature source Oral, resp. rate 18, height 5\' 5"  (1.651 m), weight 89.4 kg, last menstrual period 01/03/2019, SpO2 93 %, unknown if currently breastfeeding.  Physical Exam:  General: no distress Lochia: appropriate Uterine Fundus: firm Incision: dressing in place DVT Evaluation: No evidence of DVT seen on physical exam. No hyperreflexia  Recent Labs    01/05/20 2042 01/06/20 0234  HGB 9.6* 10.2*  HCT 29.8* 29.5*    Assessment/Plan: . Postpartum Day 1, s/p C-section . Patient desires discontinuation of magnesium. She had mag following her previous delivery and it was not well tolerated. Mag level overnight was 5.0. Rate was decreased to 1 overnight, and this morning will discontinue and monitor BP and symptoms closely. . Lactation following . Circ when able, possibly today.    LOS: 1 day   14/02/21 01/06/2020, 8:22 AM

## 2020-01-06 NOTE — Progress Notes (Signed)
MOB was referred for history of depression/anxiety. * Referral screened out by Clinical Social Worker because none of the following criteria appear to apply: ~ History of anxiety/depression during this pregnancy, or of post-partum depression following prior delivery. ~ Diagnosis of anxiety and/or depression within last 3 years OR * MOB's symptoms currently being treated with medication and/or therapy. MOB has an active Rx for Zoloft.   Please contact the Clinical Social Worker if needs arise, by MOB request, or if MOB scores greater than 9/yes to question 10 on Edinburgh Postpartum Depression Screen.  Jahron Hunsinger Boyd-Gilyard, MSW, LCSW Clinical Social Work [phone]  

## 2020-01-07 MED ORDER — ACETAMINOPHEN 325 MG PO TABS
650.0000 mg | ORAL_TABLET | ORAL | Status: DC | PRN
  Administered 2020-01-07 – 2020-01-08 (×4): 650 mg via ORAL
  Filled 2020-01-07 (×5): qty 2

## 2020-01-07 MED ORDER — IBUPROFEN 600 MG PO TABS
600.0000 mg | ORAL_TABLET | Freq: Four times a day (QID) | ORAL | Status: DC | PRN
  Administered 2020-01-07 – 2020-01-08 (×4): 600 mg via ORAL
  Filled 2020-01-07 (×4): qty 1

## 2020-01-07 NOTE — Progress Notes (Signed)
Subjective: Postpartum Day 2: Cesarean Delivery Patient reports incisional pain, tolerating PO and no problems voiding.    Objective: Vital signs in last 24 hours: Temp:  [98.1 F (36.7 C)-98.6 F (37 C)] 98.2 F (36.8 C) (12/03 0805) Pulse Rate:  [70-122] 74 (12/03 0805) Resp:  [16-18] 17 (12/03 0805) BP: (98-139)/(44-87) 114/61 (12/03 0805) SpO2:  [97 %-100 %] 97 % (12/03 0805)  Physical Exam:  General: alert, cooperative, appears older than stated age and no distress Lochia: appropriate Uterine Fundus: firm Incision: healing well DVT Evaluation: No evidence of DVT seen on physical exam.  Recent Labs    01/05/20 2042 01/06/20 0234  HGB 9.6* 10.2*  HCT 29.8* 29.5*    Assessment/Plan: Status post Cesarean section. Postoperative course complicated by Preeclampsia, s/p magnesium  Continue current care   circumcison today for baby boy.  Annette Harris 01/07/2020, 10:18 AM

## 2020-01-07 NOTE — Lactation Note (Signed)
Lactation Consultation Note  Patient Name: Annette Harris Date: 01/07/2020 Reason for consult: Follow-up assessment  LC to room for f/u visit. Mom is comfortable with positioning/latch but supplemented overnight because she was concerned about infant's intake. We reviewed feeding and output norms on day 2 of life. Encouraged feeding with cues. Patient was provided with the opportunity to ask questions. All concerns were addressed.  Will plan follow up visit prn.    Consult Status Consult Status: Follow-up Date: 01/08/20 Follow-up type: In-patient    [name] 01/07/2020, 12:12 PM

## 2020-01-07 NOTE — Lactation Note (Signed)
This note was copied from a baby's chart. Lactation Consultation Note  LC to room at RN request. Mother sleeping soundly. Spoke briefly with FOB. Will return when mom is awake. Relayed to RN.    Annette Harris 01/07/2020, 10:49 AM

## 2020-01-08 MED ORDER — OXYCODONE HCL 5 MG PO TABS: 5.0000 mg | ORAL_TABLET | ORAL | 0 refills | Status: DC | PRN

## 2020-01-08 NOTE — Lactation Note (Addendum)
This note was copied from a baby's chart. Lactation Consultation Note  Patient Name: Annette Harris [id number] Date: 01/08/2020 Reason for consult: Follow-up assessment;Other (Comment) (discharge)    This LC observed parents work together to Catering manager. LC demonstrated chin pull to deepen latch and made slight positioning adjustments for mother's comfort. Latch, output, and wt loss are all wnl today. Parents will continue to supplement p bf prn and according to Peds instructions because of infant's late preterm delivery status. Parents will f/u with Peds p d/c for infant wt check and continued care. Mother has a pump for at-home use and was counseled on prn use.Mother is aware of community resources and was provided with the opportunity to ask questions. All concerns were addressed.    Feeding Feeding Type: Breast Fed   LATCH Score: 9  Interventions Interventions: Breast feeding basics reviewed;Support pillows;Position options;Assisted with latch    Consult Status complete   [name] 01/08/2020, 8:41 AM

## 2020-01-08 NOTE — Progress Notes (Signed)
Reviewed discharge postpartum instructions with patient regarding medications, when to call MD/go to MAU, signs and symptoms of pre-e, C section incision care, and to call to schedule follow up OB appointment with provider in 4-6 weeks. Patient verbalized understanding of postpartum discharge instructions and asked appropriate questions.

## 2020-01-08 NOTE — Discharge Summary (Signed)
Postpartum Discharge Summary       Patient Name: Annette Harris DOB: [birth year] MRN: [id number]  Date of admission: 01/05/2020 Delivery date:01/05/2020  Delivering provider: Harold Hedge  Date of discharge: 01/08/2020  Admitting diagnosis: Preeclampsia, severe, third trimester [O14.13] Intrauterine pregnancy: [redacted]w[redacted]d     Secondary diagnosis:  Active Problems:   Preeclampsia, severe, third trimester  Additional problems:      Discharge diagnosis: Term Pregnancy Delivered and Gestational Hypertension                                              Post partum procedures:  Augmentation: AROM Complications: None  Hospital course: Induction of Labor With Cesarean Section   39 y.o. yo G2P0202 at [redacted]w[redacted]d was admitted to the hospital 01/05/2020 for induction of labor. Patient had a labor course significant for prolapsed dord. The patient went for cesarean section due to Cord Prolapse. Delivery details are as follows: Membrane Rupture Time/Date: 6:10 PM ,01/05/2020   Delivery Method:C-Section, Vacuum Assisted  Details of operation can be found in separate operative Note.  Patient had an uncomplicated postpartum course. She is ambulating, tolerating a regular diet, passing flatus, and urinating well.  Patient is discharged home in stable condition on 01/08/20.      Newborn Data: Birth date:01/05/2020  Birth time:6:47 PM  Gender:Female  Living status:Living  Apgars:8 ,9  Weight:3561 g                                 Magnesium Sulfate received: Yes: Seizure prophylaxis BMZ received: No Rhophylac:N/A MMR:N/A T-DaP:Given prenatally Flu: Yes Transfusion:No  Physical exam  Vitals:   01/07/20 1611 01/07/20 2014 01/07/20 2343 01/08/20 0851  BP:  125/64 (!) 97/58 130/77  Pulse:  73 70 77  Resp:  16  17  Temp:  98 F (36.7 C)  97.6 F (36.4 C)  TempSrc:  Oral  Oral  SpO2: 95% 100%  99%  Weight:      Height:       General: alert, cooperative and no distress Lochia:  appropriate Uterine Fundus: firm Incision: Healing well with no significant drainage DVT Evaluation: No evidence of DVT seen on physical exam. Labs: Lab Results  Component Value Date   WBC 17.0 (H) 01/06/2020   HGB 10.2 (L) 01/06/2020   HCT 29.5 (L) 01/06/2020   [name] 90.5 01/06/2020   PLT 122 (L) 01/06/2020   CMP Latest Ref Rng & Units 01/06/2020  Glucose 70 - 99 mg/dL 308(M)  BUN 6 - 20 mg/dL 5(L)  Creatinine 5.78 - 1.00 mg/dL 4.69  Sodium 629 - 528 mmol/L 135  Potassium 3.5 - 5.1 mmol/L 4.1  Chloride 98 - 111 mmol/L 103  CO2 22 - 32 mmol/L 23  Calcium 8.9 - 10.3 mg/dL 7.7(L)  Total Protein 6.5 - 8.1 g/dL 5.0(L)  Total Bilirubin 0.3 - 1.2 mg/dL 0.4  Alkaline Phos 38 - 126 U/L 165(H)  AST 15 - 41 U/L 31  ALT 0 - 44 U/L 18   Edinburgh Score: Edinburgh Postnatal Depression Scale Screening Tool 01/06/2020  I have been able to laugh and see the funny side of things. 0  I have looked forward with enjoyment to things. 0  I have blamed myself unnecessarily when things went wrong. 0  I have been anxious  or worried for no good reason. 0  I have felt scared or panicky for no good reason. 0  Things have been getting on top of me. 0  I have been so unhappy that I have had difficulty sleeping. 0  I have felt sad or miserable. 0  I have been so unhappy that I have been crying. 0  The thought of harming myself has occurred to me. 0  Edinburgh Postnatal Depression Scale Total 0      After visit meds:  Allergies as of 01/08/2020      Reactions   Penicillins Rash      Medication List    STOP taking these medications   FLUoxetine 20 MG capsule Commonly known as: PROZAC   MAGNESIUM PO   MAKENA IM   Progesterone 100 MG Supp   traZODone 50 MG tablet Commonly known as: DESYREL     TAKE these medications   levothyroxine 75 MCG tablet Commonly known as: SYNTHROID Take 75 mcg by mouth daily before breakfast.   oxyCODONE 5 MG immediate release tablet Commonly known as: Oxy  IR/ROXICODONE Take 1 tablet (5 mg total) by mouth every 4 (four) hours as needed for moderate pain.   PNV PO Take by mouth.   sertraline 50 MG tablet Commonly known as: ZOLOFT Take 50 mg by mouth daily.   zolpidem 10 MG tablet Commonly known as: AMBIEN Take 10 mg by mouth at bedtime as needed for sleep.        Discharge home in stable condition Infant Feeding: Breast Infant Disposition:home with mother Discharge instruction: per After Visit Summary and Postpartum booklet. Activity: Advance as tolerated. Pelvic rest for 6 weeks.  Diet: routine diet Anticipated Birth Control: Unsure Postpartum Appointment:6 weeks Additional Postpartum F/U:   Future Appointments:No future appointments. Follow up Visit:      01/08/2020 Turner Daniels, MD

## 2020-01-10 LAB — SURGICAL PATHOLOGY

## 2022-06-06 ENCOUNTER — Other Ambulatory Visit (HOSPITAL_COMMUNITY): Payer: Self-pay

## 2022-06-06 ENCOUNTER — Telehealth: Payer: Self-pay

## 2022-06-06 NOTE — Telephone Encounter (Addendum)
error 

## 2022-06-11 ENCOUNTER — Encounter: Payer: No Typology Code available for payment source | Admitting: Infectious Diseases

## 2022-10-10 ENCOUNTER — Emergency Department (HOSPITAL_BASED_OUTPATIENT_CLINIC_OR_DEPARTMENT_OTHER): Payer: No Typology Code available for payment source

## 2022-10-10 ENCOUNTER — Other Ambulatory Visit: Payer: Self-pay

## 2022-10-10 ENCOUNTER — Emergency Department (HOSPITAL_BASED_OUTPATIENT_CLINIC_OR_DEPARTMENT_OTHER)
Admission: EM | Admit: 2022-10-10 | Discharge: 2022-10-10 | Disposition: A | Discharge status: Home / Self Care | Payer: No Typology Code available for payment source | Attending: Emergency Medicine | Admitting: Emergency Medicine

## 2022-10-10 ENCOUNTER — Encounter (HOSPITAL_BASED_OUTPATIENT_CLINIC_OR_DEPARTMENT_OTHER): Payer: Self-pay | Admitting: Emergency Medicine

## 2022-10-10 DIAGNOSIS — R3121 Asymptomatic microscopic hematuria: Secondary | ICD-10-CM | POA: Insufficient documentation

## 2022-10-10 DIAGNOSIS — R1013 Epigastric pain: Secondary | ICD-10-CM | POA: Insufficient documentation

## 2022-10-10 DIAGNOSIS — R3129 Other microscopic hematuria: Secondary | ICD-10-CM

## 2022-10-10 DIAGNOSIS — R112 Nausea with vomiting, unspecified: Secondary | ICD-10-CM | POA: Diagnosis not present

## 2022-10-10 LAB — URINALYSIS, ROUTINE W REFLEX MICROSCOPIC
Bacteria, UA: NONE SEEN
Bilirubin Urine: NEGATIVE
Glucose, UA: NEGATIVE mg/dL
Ketones, ur: NEGATIVE mg/dL
Leukocytes,Ua: NEGATIVE
Nitrite: NEGATIVE
Specific Gravity, Urine: 1.024 (ref 1.005–1.030)
pH: 6.5 (ref 5.0–8.0)

## 2022-10-10 LAB — COMPREHENSIVE METABOLIC PANEL
ALT: 10 U/L (ref 0–44)
AST: 15 U/L (ref 15–41)
Albumin: 5 g/dL (ref 3.5–5.0)
Alkaline Phosphatase: 53 U/L (ref 38–126)
Anion gap: 6 (ref 5–15)
BUN: 12 mg/dL (ref 6–20)
CO2: 28 mmol/L (ref 22–32)
Calcium: 9.4 mg/dL (ref 8.9–10.3)
Chloride: 104 mmol/L (ref 98–111)
Creatinine, Ser: 0.78 mg/dL (ref 0.44–1.00)
GFR, Estimated: >60 mL/min (ref >60)
Glucose, Bld: 94 mg/dL (ref 70–99)
Potassium: 4 mmol/L (ref 3.5–5.1)
Sodium: 138 mmol/L (ref 135–145)
Total Bilirubin: 0.3 mg/dL (ref 0.3–1.2)
Total Protein: 7.2 g/dL (ref 6.5–8.1)

## 2022-10-10 LAB — CBC
HCT: 35.7 % — ABNORMAL LOW (ref 36.0–46.0)
Hemoglobin: 12 g/dL (ref 12.0–15.0)
MCH: 30.6 pg (ref 26.0–34.0)
MCHC: 33.6 g/dL (ref 30.0–36.0)
MCV: 91.1 fL (ref 80.0–100.0)
Platelets: 253 10*3/uL (ref 150–400)
RBC: 3.92 MIL/uL (ref 3.87–5.11)
RDW: 12.9 % (ref 11.5–15.5)
WBC: 5.9 10*3/uL (ref 4.0–10.5)
nRBC: 0 % (ref 0.0–0.2)

## 2022-10-10 LAB — LIPASE, BLOOD: Lipase: 20 U/L (ref 11–51)

## 2022-10-10 LAB — PREGNANCY, URINE: Preg Test, Ur: NEGATIVE

## 2022-10-10 MED ORDER — ONDANSETRON 4 MG PO TBDP
4.0000 mg | ORAL_TABLET | Freq: Once | ORAL | Status: AC
  Administered 2022-10-10: 4 mg via ORAL
  Filled 2022-10-10 (×2): qty 1

## 2022-10-10 MED ORDER — PANTOPRAZOLE SODIUM 40 MG PO TBEC
40.0000 mg | DELAYED_RELEASE_TABLET | Freq: Once | ORAL | Status: AC
  Administered 2022-10-10: 40 mg via ORAL
  Filled 2022-10-10 (×2): qty 1

## 2022-10-10 MED ORDER — ONDANSETRON HCL 4 MG PO TABS
4.0000 mg | ORAL_TABLET | Freq: Three times a day (TID) | ORAL | 0 refills | Status: AC | PRN
Start: 2022-10-10 — End: 2022-10-13

## 2022-10-10 MED ORDER — OXYCODONE HCL 5 MG PO TABS
5.0000 mg | ORAL_TABLET | Freq: Once | ORAL | Status: AC
  Administered 2022-10-10: 5 mg via ORAL
  Filled 2022-10-10: qty 1

## 2022-10-10 NOTE — Discharge Instructions (Addendum)
Thank you for letting us take care of you today.   Overall, your workup was reassuring.  Your labs are normal apart from a small amount of blood in your urine.  Your urine did not appear infected however.  The CT scan showed gallstones but no signs of a gallbladder infection.  We do not see any passing kidney stones either.  I am prescribing nausea medication for home.  Follow-up with your PCP for recheck.  They will likely want to recheck your urine and if you continue to have blood in it you may need to see urologist for further evaluation.  For new or worsening symptoms, return to the nearest ED for reevaluation.

## 2022-10-10 NOTE — ED Provider Notes (Signed)
[place] EMERGENCY DEPARTMENT AT [facility] Provider Note   CSN: [id number] Arrival date & time: 10/10/22  1804     History  Chief Complaint  Patient presents with   Abdominal Pain    Annette Harris is a 42 y.o. female with past medical history anemia, anxiety, endometriosis, thyroid disease who presents to the ED complaining of epigastric pain that radiates around to her back with nausea and vomiting that started 1 to 2 days ago.  Patient tried omeprazole at home without relief.  She does describe it as a burning pain but it does not go into her chest or throat and she denies a history of reflux.  She denies alcohol use.  She denies previous gallbladder disease.  She denies fever, diarrhea, constipation, urinary symptoms, pelvic pain, vaginal bleeding or discharge.  She had 2 episodes of nonbloody emesis today.  No history of kidney problems.       Home Medications Prior to Admission medications   Medication Sig Start Date End Date Taking? Authorizing Provider  ondansetron (ZOFRAN) 4 MG tablet Take 1 tablet (4 mg total) by mouth every 8 (eight) hours as needed for up to 3 days for nausea or vomiting. 10/10/22 10/13/22 Yes Pascale Maves L, PA-C  levothyroxine (SYNTHROID, LEVOTHROID) 75 MCG tablet Take 75 mcg by mouth daily before breakfast.    [provider]  oxyCODONE (OXY IR/ROXICODONE) 5 MG immediate release tablet Take 1 tablet (5 mg total) by mouth every 4 (four) hours as needed for moderate pain. 01/08/20   Candice Camp, MD  Prenatal Vit w/Fe-Methylfol-FA (PNV PO) Take by mouth.    [provider]  sertraline (ZOLOFT) 50 MG tablet Take 50 mg by mouth daily.    [provider]  zolpidem (AMBIEN) 10 MG tablet Take 10 mg by mouth at bedtime as needed for sleep.    [provider]      Allergies    Penicillins    Review of Systems   Review of Systems  All other systems reviewed and are negative.   Physical Exam Updated Vital  Signs BP 122/69   Pulse 70   Temp (!) 97.3 F (36.3 C)   Resp 18   LMP 09/27/2022 (Approximate)   SpO2 100%  Physical Exam Vitals and nursing note reviewed.  Constitutional:      General: She is not in acute distress.    Appearance: Normal appearance.  HENT:     Head: Normocephalic and atraumatic.     Mouth/Throat:     Mouth: Mucous membranes are moist.  Eyes:     Conjunctiva/sclera: Conjunctivae normal.  Cardiovascular:     Rate and Rhythm: Normal rate and regular rhythm.     Heart sounds: No murmur heard. Pulmonary:     Effort: Pulmonary effort is normal.     Breath sounds: Normal breath sounds.  Abdominal:     General: Abdomen is flat.     Palpations: Abdomen is soft.     Tenderness: There is abdominal tenderness in the epigastric area. There is no right CVA tenderness, left CVA tenderness, guarding or rebound. Negative signs include Murphy's sign, Rovsing's sign and McBurney's sign.  Musculoskeletal:        General: Normal range of motion.     Cervical back: Neck supple.     Right lower leg: No edema.     Left lower leg: No edema.  Skin:    General: Skin is warm and dry.     Capillary  Refill: Capillary refill takes less than 2 seconds.  Neurological:     Mental Status: She is alert. Mental status is at baseline.  Psychiatric:        Behavior: Behavior normal.     ED Results / Procedures / Treatments   Labs (all labs ordered are listed, but only abnormal results are displayed) Labs Reviewed  CBC - Abnormal; Notable for the following components:      Result Value   HCT 35.7 (*)    All other components within normal limits  URINALYSIS, ROUTINE W REFLEX MICROSCOPIC - Abnormal; Notable for the following components:   Hgb urine dipstick SMALL (*)    Protein, ur TRACE (*)    All other components within normal limits  LIPASE, BLOOD  COMPREHENSIVE METABOLIC PANEL  PREGNANCY, URINE    EKG None  Radiology CT ABDOMEN PELVIS WO CONTRAST  Result Date:  10/10/2022 CLINICAL DATA:  Acute epigastric pain with nausea and vomiting. The pain radiates to the back. EXAM: CT ABDOMEN AND PELVIS WITHOUT CONTRAST TECHNIQUE: Multidetector CT imaging of the abdomen and pelvis was performed following the standard protocol without IV contrast. RADIATION DOSE REDUCTION: This exam was performed according to the departmental dose-optimization program which includes automated exposure control, adjustment of the mA and/or kV according to patient size and/or use of iterative reconstruction technique. COMPARISON:  Pelvic ultrasound 06/25/2013 FINDINGS: Lower chest: No acute abnormality. Hepatobiliary: Unremarkable liver. Cholelithiasis without evidence of cholecystitis. No biliary dilation. Pancreas: Unremarkable. Spleen: Unremarkable. Adrenals/Urinary Tract: Normal adrenal glands. Nonobstructing calyceal stone in the right kidney. No ureteral calculi or hydronephrosis. Bladder is unremarkable. Stomach/Bowel: Normal caliber large and small bowel. No bowel wall thickening. The appendix is normal.Stomach is within normal limits. Vascular/Lymphatic: No significant vascular findings are present. No enlarged abdominal or pelvic lymph nodes. Reproductive: Unremarkable. Other: No free intraperitoneal fluid or air. Musculoskeletal: No acute fracture. IMPRESSION: 1. No acute abnormality in the abdomen or pelvis. 2. Cholelithiasis without evidence of cholecystitis. 3. Nonobstructing calyceal stone in the right kidney. Electronically Signed   By: Minerva Fester M.D.   On: 10/10/2022 21:05    Procedures Procedures    Medications Ordered in ED Medications  pantoprazole (PROTONIX) EC tablet 40 mg (40 mg Oral Given 10/10/22 1952)  ondansetron (ZOFRAN-ODT) disintegrating tablet 4 mg (4 mg Oral Given 10/10/22 1954)  oxyCODONE (Oxy IR/ROXICODONE) immediate release tablet 5 mg (5 mg Oral Given 10/10/22 2110)    ED Course/ Medical Decision Making/ A&P                                 Medical  Decision Making Amount and/or Complexity of Data Reviewed Labs: ordered. Decision-making details documented in ED Course. Radiology: ordered. Decision-making details documented in ED Course.  Risk Prescription drug management.   Medical Decision Making:   DESLYN SCHMELTER is a 42 y.o. female who presented to the ED today with abdominal pain detailed above.    Additional history discussed with patient's family/caregivers.  Complete initial physical exam performed, notably the patient  was with mild epigastric abdominal tenderness.  Negative Murphy sign.  She was well-appearing.  No CVA tenderness.  Neurologically intact.    Reviewed and confirmed nursing documentation for past medical history, family history, social history.    Initial Assessment:   With the patient's presentation of abdominal pain, differential diagnosis includes but is not limited to AAA, mesenteric ischemia, appendicitis, diverticulitis, DKA, gastritis, gastroenteritis, AMI, nephrolithiasis,  pancreatitis, peritonitis, adrenal insufficiency, intestinal ischemia, constipation, UTI, SBO/LBO, splenic rupture, biliary disease, IBD, IBS, PUD, hepatitis, STD, ovarian/testicular torsion, electrolyte disturbance, DKA, dehydration, acute kidney injury, renal failure, cholecystitis, cholelithiasis, choledocholithiasis, abdominal pain of  unknown etiology, pregnancy, incomplete abortion, septic abortion, threatened abortion, ectopic pregnancy, PID.   Initial Plan:  Screening labs including CBC and Metabolic panel to evaluate for infectious or metabolic etiology of disease.  Lipase to evaluate for pancreatitis Urinalysis with reflex culture ordered to evaluate for UTI or relevant urologic/nephrologic pathology.  CT abd/pelvis to evaluate for intra-abdominal pathology Symptomatic management Objective evaluation as reviewed   Initial Study Results:   Laboratory  All laboratory results reviewed without evidence of clinically relevant  pathology.   Exceptions include: UA with small amounts of hemoglobin  Radiology:  All images reviewed independently. Agree with radiology report at this time.   CT ABDOMEN PELVIS WO CONTRAST  Result Date: 10/10/2022 CLINICAL DATA:  Acute epigastric pain with nausea and vomiting. The pain radiates to the back. EXAM: CT ABDOMEN AND PELVIS WITHOUT CONTRAST TECHNIQUE: Multidetector CT imaging of the abdomen and pelvis was performed following the standard protocol without IV contrast. RADIATION DOSE REDUCTION: This exam was performed according to the departmental dose-optimization program which includes automated exposure control, adjustment of the mA and/or kV according to patient size and/or use of iterative reconstruction technique. COMPARISON:  Pelvic ultrasound 06/25/2013 FINDINGS: Lower chest: No acute abnormality. Hepatobiliary: Unremarkable liver. Cholelithiasis without evidence of cholecystitis. No biliary dilation. Pancreas: Unremarkable. Spleen: Unremarkable. Adrenals/Urinary Tract: Normal adrenal glands. Nonobstructing calyceal stone in the right kidney. No ureteral calculi or hydronephrosis. Bladder is unremarkable. Stomach/Bowel: Normal caliber large and small bowel. No bowel wall thickening. The appendix is normal.Stomach is within normal limits. Vascular/Lymphatic: No significant vascular findings are present. No enlarged abdominal or pelvic lymph nodes. Reproductive: Unremarkable. Other: No free intraperitoneal fluid or air. Musculoskeletal: No acute fracture. IMPRESSION: 1. No acute abnormality in the abdomen or pelvis. 2. Cholelithiasis without evidence of cholecystitis. 3. Nonobstructing calyceal stone in the right kidney. Electronically Signed   By: Minerva Fester M.D.   On: 10/10/2022 21:05      Final Assessment and Plan:   42 year old female presents the ED with epigastric abdominal pain.  Describes it as a burning pain.  No alcohol use.  Negative Murphy sign.  Slight epigastric  tenderness but patient well-appearing.  She did have onset of vomiting today.  No history of reflux.  Occurs frequently and no association with eating.  Patient afebrile, nontoxic-appearing.  Does have a small amount of hemoglobin in her urine but does not appear acutely infected.  Pregnancy test negative.  Blood work unremarkable.  Does have some radiating pain into the back but it radiates bilaterally.  With hematuria, discussed possibility of kidney stone and this would be an atypical presentation.  Patient would like to proceed with scanning to rule out this and gallbladder disease though no elevated bilirubin, LFTs, no leukocytosis so low suspicion for acute cholecystitis.  Patient has cholelithiasis but no acute cholecystitis.  No other significant acute findings on CT scan.  Pain well-controlled on reexam.  Higher suspicion for GERD/PUD.  Discussed with patient recommendation for close follow-up with her PCP and she is agreeable and I will do so.  Aware that if symptoms continue she may need to see GI specialist.  Will provide with emesis control at home.  Strict ED return precautions given, all questions answered.  Stable for discharge.   Clinical Impression:  1. Epigastric abdominal  pain   2. Nausea and vomiting, unspecified vomiting type   3. Microscopic hematuria      Discharge           Final Clinical Impression(s) / ED Diagnoses Final diagnoses:  Epigastric abdominal pain  Nausea and vomiting, unspecified vomiting type  Microscopic hematuria    Rx / DC Orders ED Discharge Orders          Ordered    ondansetron (ZOFRAN) 4 MG tablet  Every 8 hours PRN        10/10/22 2208              Tonette Lederer, PA-C 10/10/22 2332    Gwyneth Sprout, MD 10/11/22 2334

## 2022-10-10 NOTE — ED Notes (Signed)
Reviewed AVS/discharge instruction with patient. Time allotted for and all questions answered. Patient is agreeable for d/c and escorted to ed exit by staff.  

## 2022-10-10 NOTE — ED Triage Notes (Signed)
Mid upper (epigastric) abdominal pain x "a couple" weeks Today feels work with nausea and vomiting. Burning and radiates to back  OTC omeprazole with not relief

## 2022-11-04 ENCOUNTER — Ambulatory Visit: Payer: Self-pay | Admitting: General Surgery

## 2022-11-04 NOTE — Pre-Procedure Instructions (Signed)
Surgical Instructions   Your procedure is scheduled on October 8, 20224. Report to [facility] Main Entrance "A" at 5:30 A.M., then check in with the Admitting office. Any questions or running late day of surgery: call [phone]  Questions prior to your surgery date: call [phone], Monday-Friday, 8am-4pm. If you experience any cold or flu symptoms such as cough, fever, chills, shortness of breath, etc. between now and your scheduled surgery, please notify us at the above number.     Remember:  Do not eat or drink after midnight the night before your surgery    Take these medicines the morning of surgery with A SIP OF WATER: levothyroxine (SYNTHROID)    One week prior to surgery, STOP taking any Aspirin (unless otherwise instructed by your surgeon) Aleve, Naproxen, Ibuprofen, Motrin, Advil, Goody's, BC's, all herbal medications, fish oil, and non-prescription vitamins.                     Do NOT Smoke (Tobacco/Vaping) for 24 hours prior to your procedure.  If you use a CPAP at night, you may bring your mask/headgear for your overnight stay.   You will be asked to remove any contacts, glasses, piercing's, hearing aid's, dentures/partials prior to surgery. Please bring cases for these items if needed.    Patients discharged the day of surgery will not be allowed to drive home, and someone needs to stay with them for 24 hours.  SURGICAL WAITING ROOM VISITATION Patients may have no more than 2 support people in the waiting area - these visitors may rotate.   Pre-op nurse will coordinate an appropriate time for 1 ADULT support person, who may not rotate, to accompany patient in pre-op.  Children under the age of 69 must have an adult with them who is not the patient and must remain in the main waiting area with an adult.  If the patient needs to stay at the hospital during part of their recovery, the visitor guidelines for inpatient rooms apply.  Please refer to the [facility]  website for the visitor guidelines for any additional information.   If you received a COVID test during your pre-op visit  it is requested that you wear a mask when out in public, stay away from anyone that may not be feeling well and notify your surgeon if you develop symptoms. If you have been in contact with anyone that has tested positive in the last 10 days please notify you surgeon.      Pre-operative CHG Bathing Instructions   You can play a key role in reducing the risk of infection after surgery. Your skin needs to be as free of germs as possible. You can reduce the number of germs on your skin by washing with CHG (chlorhexidine gluconate) soap before surgery. CHG is an antiseptic soap that kills germs and continues to kill germs even after washing.   DO NOT use if you have an allergy to chlorhexidine/CHG or antibacterial soaps. If your skin becomes reddened or irritated, stop using the CHG and notify one of our RNs at [phone].              TAKE A SHOWER THE NIGHT BEFORE SURGERY AND THE DAY OF SURGERY    Please keep in mind the following:  DO NOT shave, including legs and underarms, 48 hours prior to surgery.   You may shave your face before/day of surgery.  Place clean sheets on your bed the night before surgery Use  a clean washcloth (not used since being washed) for each shower. DO NOT sleep with pet's night before surgery.  CHG Shower Instructions:  Wash your face and private area with normal soap. If you choose to wash your hair, wash first with your normal shampoo.  After you use shampoo/soap, rinse your hair and body thoroughly to remove shampoo/soap residue.  Turn the water OFF and apply half the bottle of CHG soap to a CLEAN washcloth.  Apply CHG soap ONLY FROM YOUR NECK DOWN TO YOUR TOES (washing for 3-5 minutes)  DO NOT use CHG soap on face, private areas, open wounds, or sores.  Pay special attention to the area where your surgery is being performed.  If you  are having back surgery, having someone wash your back for you may be helpful. Wait 2 minutes after CHG soap is applied, then you may rinse off the CHG soap.  Pat dry with a clean towel  Put on clean pajamas    Additional instructions for the day of surgery: DO NOT APPLY any lotions, deodorants, cologne, or perfumes.   Do not wear jewelry or makeup Do not wear nail polish, gel polish, artificial nails, or any other type of covering on natural nails (fingers and toes) Do not bring valuables to the hospital. [facility] is not responsible for valuables/personal belongings. Put on clean/comfortable clothes.  Please brush your teeth.  Ask your nurse before applying any prescription medications to the skin.

## 2022-11-05 ENCOUNTER — Encounter (HOSPITAL_COMMUNITY): Payer: Self-pay

## 2022-11-05 ENCOUNTER — Other Ambulatory Visit: Payer: Self-pay

## 2022-11-05 ENCOUNTER — Encounter (HOSPITAL_COMMUNITY)
Admission: RE | Admit: 2022-11-05 | Discharge: 2022-11-05 | Disposition: A | Discharge status: Home / Self Care | Payer: No Typology Code available for payment source | Source: Ambulatory Visit | Attending: General Surgery | Admitting: General Surgery

## 2022-11-05 VITALS — BP 115/67 | HR 74 | Temp 98.3°F | Resp 18 | Ht 65.0 in | Wt 174.1 lb

## 2022-11-05 DIAGNOSIS — Z01812 Encounter for preprocedural laboratory examination: Secondary | ICD-10-CM | POA: Insufficient documentation

## 2022-11-05 DIAGNOSIS — Z01818 Encounter for other preprocedural examination: Secondary | ICD-10-CM

## 2022-11-05 HISTORY — DX: Personal history of urinary calculi: Z87.442

## 2022-11-05 LAB — CBC
HCT: 37.4 % (ref 36.0–46.0)
Hemoglobin: 12.1 g/dL (ref 12.0–15.0)
MCH: 29.7 pg (ref 26.0–34.0)
MCHC: 32.4 g/dL (ref 30.0–36.0)
MCV: 91.7 fL (ref 80.0–100.0)
Platelets: 259 10*3/uL (ref 150–400)
RBC: 4.08 MIL/uL (ref 3.87–5.11)
RDW: 12.7 % (ref 11.5–15.5)
WBC: 6.4 10*3/uL (ref 4.0–10.5)
nRBC: 0 % (ref 0.0–0.2)

## 2022-11-05 NOTE — Progress Notes (Signed)
PCP - Dr. Martha Clan Cardiologist - Denies  PPM/ICD - Denies Device Orders - n/a Rep Notified - n/a  Chest x-ray - Denies EKG - Denies Stress Test - Denies ECHO - Denies Cardiac Cath - Denies  Sleep Study - Denies CPAP - n/a  No DM  Last dose of GLP1 agonist- n/a GLP1 instructions: n/a  Blood Thinner Instructions: n/a Aspirin Instructions: n/a  NPO after midnight  COVID TEST- n/a   Anesthesia review: No.   Patient denies shortness of breath, fever, cough and chest pain at PAT appointment. Pt denies any respiratory illness/infection in the last two months.    All instructions explained to the patient, with a verbal understanding of the material. Patient agrees to go over the instructions while at home for a better understanding. Patient also instructed to self quarantine after being tested for COVID-19. The opportunity to ask questions was provided.

## 2022-11-11 NOTE — Anesthesia Preprocedure Evaluation (Signed)
Anesthesia Evaluation  Patient identified by MRN, date of birth, ID band Patient awake    Reviewed: Allergy & Precautions, NPO status , Patient's Chart, lab work & pertinent test results  History of Anesthesia Complications Negative for: history of anesthetic complications  Airway Mallampati: II  TM Distance: >3 FB Neck ROM: Full    Dental  (+) Dental Advisory Given   Pulmonary neg pulmonary ROS   Pulmonary exam normal breath sounds clear to auscultation       Cardiovascular negative cardio ROS  Rhythm:Regular Rate:Normal     Neuro/Psych neg Seizures PSYCHIATRIC DISORDERS Anxiety      Neuromuscular disease (Dermatomyositis, in remission)    GI/Hepatic Neg liver ROS,neg GERD  ,,Cholelithiasis    Endo/Other  neg diabetesHypothyroidism    Renal/GU negative Renal ROS     Musculoskeletal   Abdominal   Peds  Hematology  (+) Blood dyscrasia, anemia Lab Results      Component                Value               Date                      WBC                      6.4                 11/05/2022                HGB                      12.1                11/05/2022                HCT                      37.4                11/05/2022                [name]                      91.7                11/05/2022                PLT                      259                 11/05/2022              Anesthesia Other Findings   Reproductive/Obstetrics endometriosis                             Anesthesia Physical Anesthesia Plan  ASA: 2  Anesthesia Plan: General   Post-op Pain Management: Tylenol PO (pre-op)*   Induction: Intravenous  PONV Risk Score and Plan: 3 and Ondansetron and Dexamethasone  Airway Management Planned: Oral ETT  Additional Equipment:   Intra-op Plan:   Post-operative Plan: Extubation in OR  Informed Consent: I have reviewed the patients History and Physical, chart, labs and  discussed the procedure including the risks, benefits and alternatives for the  proposed anesthesia with the patient or authorized representative who has indicated his/her understanding and acceptance.     Dental advisory given  Plan Discussed with: CRNA and Anesthesiologist  Anesthesia Plan Comments: (Risks of general anesthesia discussed including, but not limited to, sore throat, hoarse voice, chipped/damaged teeth, injury to vocal cords, nausea and vomiting, allergic reactions, lung infection, heart attack, stroke, and death. All questions answered. )       Anesthesia Quick Evaluation

## 2022-11-12 ENCOUNTER — Ambulatory Visit (HOSPITAL_COMMUNITY)
Admission: RE | Admit: 2022-11-12 | Discharge: 2022-11-12 | Disposition: A | Discharge status: Home / Self Care | Payer: No Typology Code available for payment source | Source: Ambulatory Visit | Attending: General Surgery | Admitting: General Surgery

## 2022-11-12 ENCOUNTER — Encounter (HOSPITAL_COMMUNITY): Payer: Self-pay | Admitting: General Surgery

## 2022-11-12 ENCOUNTER — Encounter (HOSPITAL_COMMUNITY)
Admission: RE | Disposition: A | Discharge status: Home / Self Care | Payer: Self-pay | Source: Ambulatory Visit | Attending: General Surgery

## 2022-11-12 ENCOUNTER — Ambulatory Visit (HOSPITAL_BASED_OUTPATIENT_CLINIC_OR_DEPARTMENT_OTHER): Payer: No Typology Code available for payment source | Admitting: Certified Registered"

## 2022-11-12 ENCOUNTER — Other Ambulatory Visit: Payer: Self-pay

## 2022-11-12 ENCOUNTER — Ambulatory Visit (HOSPITAL_COMMUNITY): Payer: No Typology Code available for payment source

## 2022-11-12 ENCOUNTER — Ambulatory Visit (HOSPITAL_COMMUNITY): Payer: No Typology Code available for payment source | Admitting: Certified Registered"

## 2022-11-12 DIAGNOSIS — K8010 Calculus of gallbladder with chronic cholecystitis without obstruction: Secondary | ICD-10-CM | POA: Insufficient documentation

## 2022-11-12 DIAGNOSIS — K8020 Calculus of gallbladder without cholecystitis without obstruction: Secondary | ICD-10-CM | POA: Diagnosis not present

## 2022-11-12 DIAGNOSIS — K9189 Other postprocedural complications and disorders of digestive system: Secondary | ICD-10-CM | POA: Diagnosis not present

## 2022-11-12 DIAGNOSIS — Z01818 Encounter for other preprocedural examination: Secondary | ICD-10-CM

## 2022-11-12 DIAGNOSIS — E039 Hypothyroidism, unspecified: Secondary | ICD-10-CM | POA: Insufficient documentation

## 2022-11-12 DIAGNOSIS — R109 Unspecified abdominal pain: Secondary | ICD-10-CM | POA: Diagnosis not present

## 2022-11-12 HISTORY — PX: CHOLECYSTECTOMY: SHX55

## 2022-11-12 LAB — POCT PREGNANCY, URINE: Preg Test, Ur: NEGATIVE

## 2022-11-12 SURGERY — LAPAROSCOPIC CHOLECYSTECTOMY WITH INTRAOPERATIVE CHOLANGIOGRAM
Anesthesia: General | Site: Abdomen

## 2022-11-12 MED ORDER — HYDROMORPHONE HCL 1 MG/ML IJ SOLN
INTRAMUSCULAR | Status: AC
  Filled 2022-11-12: qty 1

## 2022-11-12 MED ORDER — ACETAMINOPHEN 325 MG PO TABS: 650.0000 mg | ORAL_TABLET | ORAL | 0 refills | Status: AC | PRN

## 2022-11-12 MED ORDER — SODIUM CHLORIDE 0.9 % IR SOLN
Status: DC | PRN
  Administered 2022-11-12: 1

## 2022-11-12 MED ORDER — FENTANYL CITRATE (PF) 100 MCG/2ML IJ SOLN
INTRAMUSCULAR | Status: AC
  Filled 2022-11-12: qty 2

## 2022-11-12 MED ORDER — ROCURONIUM BROMIDE 10 MG/ML (PF) SYRINGE
PREFILLED_SYRINGE | INTRAVENOUS | Status: AC
  Filled 2022-11-12: qty 10

## 2022-11-12 MED ORDER — CHLORHEXIDINE GLUCONATE CLOTH 2 % EX PADS: 6.0000 | MEDICATED_PAD | Freq: Once | CUTANEOUS | Status: DC

## 2022-11-12 MED ORDER — AMISULPRIDE (ANTIEMETIC) 5 MG/2ML IV SOLN
10.0000 mg | Freq: Once | INTRAVENOUS | Status: AC | PRN
  Administered 2022-11-12: 10 mg via INTRAVENOUS

## 2022-11-12 MED ORDER — LIDOCAINE 2% (20 MG/ML) 5 ML SYRINGE
INTRAMUSCULAR | Status: AC
  Filled 2022-11-12: qty 5

## 2022-11-12 MED ORDER — ACETAMINOPHEN 325 MG PO TABS: 650.0000 mg | ORAL_TABLET | ORAL | Status: DC | PRN

## 2022-11-12 MED ORDER — MORPHINE SULFATE (PF) 2 MG/ML IV SOLN: 2.0000 mg | INTRAVENOUS | Status: DC | PRN

## 2022-11-12 MED ORDER — HYDROMORPHONE HCL 1 MG/ML IJ SOLN
0.2500 mg | INTRAMUSCULAR | Status: DC | PRN
  Administered 2022-11-12 (×2): 0.5 mg via INTRAVENOUS

## 2022-11-12 MED ORDER — PROPOFOL 10 MG/ML IV BOLUS
INTRAVENOUS | Status: DC | PRN
  Administered 2022-11-12: 150 mg via INTRAVENOUS

## 2022-11-12 MED ORDER — ONDANSETRON HCL 4 MG/2ML IJ SOLN
INTRAMUSCULAR | Status: DC | PRN
  Administered 2022-11-12: 4 mg via INTRAVENOUS

## 2022-11-12 MED ORDER — SCOPOLAMINE 1 MG/3DAYS TD PT72
MEDICATED_PATCH | TRANSDERMAL | Status: AC
  Filled 2022-11-12: qty 1

## 2022-11-12 MED ORDER — OXYCODONE HCL 5 MG PO TABS: 5.0000 mg | ORAL_TABLET | Freq: Four times a day (QID) | ORAL | 0 refills | Status: AC | PRN

## 2022-11-12 MED ORDER — SUGAMMADEX SODIUM 200 MG/2ML IV SOLN
INTRAVENOUS | Status: DC | PRN
  Administered 2022-11-12: 200 mg via INTRAVENOUS

## 2022-11-12 MED ORDER — GENTAMICIN SULFATE 40 MG/ML IJ SOLN
5.0000 mg/kg | INTRAVENOUS | Status: AC
  Administered 2022-11-12: 390 mg via INTRAVENOUS
  Filled 2022-11-12: qty 9.75

## 2022-11-12 MED ORDER — AMISULPRIDE (ANTIEMETIC) 5 MG/2ML IV SOLN
INTRAVENOUS | Status: AC
  Filled 2022-11-12: qty 4

## 2022-11-12 MED ORDER — LACTATED RINGERS IV SOLN
INTRAVENOUS | Status: DC | PRN
Start: 2022-11-12 — End: 2022-11-12

## 2022-11-12 MED ORDER — OXYCODONE HCL 5 MG PO TABS
ORAL_TABLET | ORAL | Status: AC
  Filled 2022-11-12: qty 1

## 2022-11-12 MED ORDER — OXYCODONE HCL 5 MG/5ML PO SOLN: 5.0000 mg | Freq: Once | ORAL | Status: AC | PRN

## 2022-11-12 MED ORDER — ACETAMINOPHEN 500 MG PO TABS
1000.0000 mg | ORAL_TABLET | Freq: Once | ORAL | Status: AC
  Administered 2022-11-12: 1000 mg via ORAL
  Filled 2022-11-12: qty 2

## 2022-11-12 MED ORDER — MIDAZOLAM HCL 2 MG/2ML IJ SOLN
INTRAMUSCULAR | Status: DC | PRN
  Administered 2022-11-12: 2 mg via INTRAVENOUS

## 2022-11-12 MED ORDER — FENTANYL CITRATE (PF) 250 MCG/5ML IJ SOLN
INTRAMUSCULAR | Status: DC | PRN
  Administered 2022-11-12: 100 ug via INTRAVENOUS
  Administered 2022-11-12 (×2): 50 ug via INTRAVENOUS

## 2022-11-12 MED ORDER — FENTANYL CITRATE (PF) 100 MCG/2ML IJ SOLN
25.0000 ug | INTRAMUSCULAR | Status: DC | PRN
  Administered 2022-11-12 (×3): 50 ug via INTRAVENOUS

## 2022-11-12 MED ORDER — ACETAMINOPHEN 650 MG RE SUPP: 650.0000 mg | RECTAL | Status: DC | PRN

## 2022-11-12 MED ORDER — DEXMEDETOMIDINE HCL IN NACL 80 MCG/20ML IV SOLN
INTRAVENOUS | Status: DC | PRN
Start: 2022-11-12 — End: 2022-11-12
  Administered 2022-11-12: 10 ug via INTRAVENOUS
  Administered 2022-11-12: 8 ug via INTRAVENOUS

## 2022-11-12 MED ORDER — DEXAMETHASONE SODIUM PHOSPHATE 10 MG/ML IJ SOLN
INTRAMUSCULAR | Status: AC
  Filled 2022-11-12: qty 1

## 2022-11-12 MED ORDER — PHENYLEPHRINE 80 MCG/ML (10ML) SYRINGE FOR IV PUSH (FOR BLOOD PRESSURE SUPPORT)
PREFILLED_SYRINGE | INTRAVENOUS | Status: DC | PRN
  Administered 2022-11-12: 80 ug via INTRAVENOUS

## 2022-11-12 MED ORDER — BUPIVACAINE-EPINEPHRINE (PF) 0.25% -1:200000 IJ SOLN
INTRAMUSCULAR | Status: AC
  Filled 2022-11-12: qty 30

## 2022-11-12 MED ORDER — ORAL CARE MOUTH RINSE: 15.0000 mL | Freq: Once | OROMUCOSAL | Status: AC

## 2022-11-12 MED ORDER — OXYCODONE HCL 5 MG PO TABS: 5.0000 mg | ORAL_TABLET | ORAL | Status: DC | PRN

## 2022-11-12 MED ORDER — 0.9 % SODIUM CHLORIDE (POUR BTL) OPTIME
TOPICAL | Status: DC | PRN
  Administered 2022-11-12: 1000 mL

## 2022-11-12 MED ORDER — ROCURONIUM BROMIDE 10 MG/ML (PF) SYRINGE
PREFILLED_SYRINGE | INTRAVENOUS | Status: DC | PRN
  Administered 2022-11-12: 60 mg via INTRAVENOUS
  Administered 2022-11-12: 20 mg via INTRAVENOUS

## 2022-11-12 MED ORDER — IBUPROFEN 200 MG PO TABS: 600.0000 mg | ORAL_TABLET | Freq: Four times a day (QID) | ORAL | 0 refills | Status: AC | PRN

## 2022-11-12 MED ORDER — SCOPOLAMINE 1 MG/3DAYS TD PT72
1.0000 | MEDICATED_PATCH | Freq: Once | TRANSDERMAL | Status: DC
  Administered 2022-11-12: 1.5 mg via TRANSDERMAL

## 2022-11-12 MED ORDER — SODIUM CHLORIDE 0.9% FLUSH: 3.0000 mL | INTRAVENOUS | Status: DC | PRN

## 2022-11-12 MED ORDER — CLINDAMYCIN PHOSPHATE 900 MG/50ML IV SOLN
900.0000 mg | INTRAVENOUS | Status: AC
  Administered 2022-11-12: 900 mg via INTRAVENOUS
  Filled 2022-11-12: qty 50

## 2022-11-12 MED ORDER — CHLORHEXIDINE GLUCONATE 0.12 % MT SOLN
15.0000 mL | Freq: Once | OROMUCOSAL | Status: AC
  Administered 2022-11-12: 15 mL via OROMUCOSAL
  Filled 2022-11-12: qty 15

## 2022-11-12 MED ORDER — SODIUM CHLORIDE 0.9 % IV SOLN
INTRAVENOUS | Status: DC | PRN
  Administered 2022-11-12: 14 mL

## 2022-11-12 MED ORDER — DEXAMETHASONE SODIUM PHOSPHATE 10 MG/ML IJ SOLN
INTRAMUSCULAR | Status: DC | PRN
  Administered 2022-11-12: 10 mg via INTRAVENOUS

## 2022-11-12 MED ORDER — HYDROMORPHONE HCL 1 MG/ML IJ SOLN
INTRAMUSCULAR | Status: DC | PRN
Start: 2022-11-12 — End: 2022-11-12
  Administered 2022-11-12: .5 mg via INTRAVENOUS

## 2022-11-12 MED ORDER — HYDROMORPHONE HCL 1 MG/ML IJ SOLN
INTRAMUSCULAR | Status: AC
  Filled 2022-11-12: qty 0.5

## 2022-11-12 MED ORDER — MIDAZOLAM HCL 2 MG/2ML IJ SOLN
INTRAMUSCULAR | Status: AC
  Filled 2022-11-12: qty 2

## 2022-11-12 MED ORDER — LACTATED RINGERS IV SOLN: INTRAVENOUS | Status: DC

## 2022-11-12 MED ORDER — BUPIVACAINE-EPINEPHRINE 0.25% -1:200000 IJ SOLN
INTRAMUSCULAR | Status: DC | PRN
  Administered 2022-11-12: 19 mL

## 2022-11-12 MED ORDER — SODIUM CHLORIDE 0.9% FLUSH: 10.0000 mL | Freq: Two times a day (BID) | INTRAVENOUS | Status: DC

## 2022-11-12 MED ORDER — PROPOFOL 10 MG/ML IV BOLUS
INTRAVENOUS | Status: AC
  Filled 2022-11-12: qty 20

## 2022-11-12 MED ORDER — FENTANYL CITRATE (PF) 250 MCG/5ML IJ SOLN
INTRAMUSCULAR | Status: AC
  Filled 2022-11-12: qty 5

## 2022-11-12 MED ORDER — SODIUM CHLORIDE 0.9% FLUSH: 3.0000 mL | Freq: Two times a day (BID) | INTRAVENOUS | Status: DC

## 2022-11-12 MED ORDER — SODIUM CHLORIDE 0.9 % IV SOLN: 250.0000 mL | INTRAVENOUS | Status: DC | PRN

## 2022-11-12 MED ORDER — LIDOCAINE 2% (20 MG/ML) 5 ML SYRINGE
INTRAMUSCULAR | Status: DC | PRN
  Administered 2022-11-12: 100 mg via INTRAVENOUS

## 2022-11-12 MED ORDER — OXYCODONE HCL 5 MG PO TABS
5.0000 mg | ORAL_TABLET | Freq: Once | ORAL | Status: AC | PRN
  Administered 2022-11-12: 5 mg via ORAL

## 2022-11-12 MED ORDER — ONDANSETRON HCL 4 MG/2ML IJ SOLN
INTRAMUSCULAR | Status: AC
  Filled 2022-11-12: qty 2

## 2022-11-12 SURGICAL SUPPLY — 55 items
ADH SKN CLS APL DERMABOND .7 (GAUZE/BANDAGES/DRESSINGS) ×1
APL PRP STRL LF DISP 70% ISPRP (MISCELLANEOUS) ×1
APPLIER CLIP ROT 10 11.4 M/L (STAPLE) ×1
APR CLP MED LRG 11.4X10 (STAPLE) ×1
BAG COUNTER SPONGE SURGICOUNT (BAG) ×1 IMPLANT
BAG SPEC RTRVL 10 TROC 200 (ENDOMECHANICALS) ×1
BAG SPNG CNTER NS LX DISP (BAG)
CANISTER SUCT 3000ML PPV (MISCELLANEOUS) ×1 IMPLANT
CATH URETL OPEN END 6FR 70 (CATHETERS) ×1 IMPLANT
CHLORAPREP W/TINT 26 (MISCELLANEOUS) ×1 IMPLANT
CLIP APPLIE ROT 10 11.4 M/L (STAPLE) ×1 IMPLANT
COVER MAYO STAND STRL (DRAPES) IMPLANT
COVER SURGICAL LIGHT HANDLE (MISCELLANEOUS) ×1 IMPLANT
DERMABOND ADVANCED .7 DNX12 (GAUZE/BANDAGES/DRESSINGS) ×1 IMPLANT
DRAPE C-ARM 42X120 X-RAY (DRAPES) ×1 IMPLANT
ELECT REM PT RETURN 9FT ADLT (ELECTROSURGICAL) ×1
ELECTRODE REM PT RTRN 9FT ADLT (ELECTROSURGICAL) ×1 IMPLANT
ENDOLOOP SUT PDS II 0 18 (SUTURE) IMPLANT
GLOVE BIO SURGEON STRL SZ7 (GLOVE) ×1 IMPLANT
GOWN STRL REUS W/ TWL LRG LVL3 (GOWN DISPOSABLE) ×2 IMPLANT
GOWN STRL REUS W/ TWL XL LVL3 (GOWN DISPOSABLE) ×1 IMPLANT
GOWN STRL REUS W/TWL LRG LVL3 (GOWN DISPOSABLE) ×6
GOWN STRL REUS W/TWL XL LVL3 (GOWN DISPOSABLE) ×1
GRASPER SUT TROCAR 14GX15 (MISCELLANEOUS) ×1 IMPLANT
IRRIG SUCT STRYKERFLOW 2 WTIP (MISCELLANEOUS) ×1
IRRIGATION SUCT STRKRFLW 2 WTP (MISCELLANEOUS) ×1 IMPLANT
KIT BASIN OR (CUSTOM PROCEDURE TRAY) ×1 IMPLANT
KIT IMAGING PINPOINTPAQ (MISCELLANEOUS) IMPLANT
KIT TURNOVER KIT B (KITS) ×1 IMPLANT
L-HOOK LAP DISP 36CM (ELECTROSURGICAL) ×1
LHOOK LAP DISP 36CM (ELECTROSURGICAL) ×1 IMPLANT
NDL 22X1.5 STRL (OR ONLY) (MISCELLANEOUS) ×1 IMPLANT
NDL INSUFFLATION 14GA 120MM (NEEDLE) ×1 IMPLANT
NEEDLE 22X1.5 STRL (OR ONLY) (MISCELLANEOUS) ×1
NEEDLE INSUFFLATION 14GA 120MM (NEEDLE) ×1
NS IRRIG 1000ML POUR BTL (IV SOLUTION) ×1 IMPLANT
PAD ARMBOARD 7.5X6 YLW CONV (MISCELLANEOUS) ×1 IMPLANT
PENCIL BUTTON HOLSTER BLD 10FT (ELECTRODE) ×1 IMPLANT
POUCH RETRIEVAL ECOSAC 10 (ENDOMECHANICALS) ×1 IMPLANT
SCISSORS LAP 5X35 DISP (ENDOMECHANICALS) ×1 IMPLANT
SET CHOLANGIOGRAPH 5 50 .035 (SET/KITS/TRAYS/PACK) IMPLANT
SET TUBE SMOKE EVAC HIGH FLOW (TUBING) ×1 IMPLANT
SLEEVE Z-THREAD 5X100MM (TROCAR) ×2 IMPLANT
SPECIMEN JAR SMALL (MISCELLANEOUS) ×1 IMPLANT
STOPCOCK 4 WAY LG BORE MALE ST (IV SETS) ×1 IMPLANT
SUT MNCRL AB 4-0 PS2 18 (SUTURE) ×1 IMPLANT
SYS BAG RETRIEVAL 10MM (BASKET)
SYSTEM BAG RETRIEVAL 10MM (BASKET) IMPLANT
TOWEL GREEN STERILE (TOWEL DISPOSABLE) ×1 IMPLANT
TOWEL GREEN STERILE FF (TOWEL DISPOSABLE) ×1 IMPLANT
TRAY LAPAROSCOPIC MC (CUSTOM PROCEDURE TRAY) ×1 IMPLANT
TROCAR Z THREAD OPTICAL 12X100 (TROCAR) ×1 IMPLANT
TROCAR Z-THREAD OPTICAL 5X100M (TROCAR) ×1 IMPLANT
WARMER LAPAROSCOPE (MISCELLANEOUS) ×1 IMPLANT
WATER STERILE IRR 1000ML POUR (IV SOLUTION) ×1 IMPLANT

## 2022-11-12 NOTE — Transfer of Care (Signed)
Immediate Anesthesia Transfer of Care Note  Patient: Annette Harris  Procedure(s) Performed: LAPAROSCOPIC CHOLECYSTECTOMY WITH INTRAOPERATIVE CHOLANGIOGRAM (Abdomen)  Patient Location: PACU  Anesthesia Type:General  Level of Consciousness: drowsy and patient cooperative  Airway & Oxygen Therapy: Patient Spontanous Breathing and Patient connected to nasal cannula oxygen  Post-op Assessment: Report given to RN and Post -op Vital signs reviewed and stable  Post vital signs: Reviewed and stable  Last Vitals:  Vitals Value Taken Time  BP 115/68 11/12/22 0925  Temp 36.7 C 11/12/22 0925  Pulse 72 11/12/22 0927  Resp 18 11/12/22 0927  SpO2 92 % 11/12/22 0927  Vitals shown include unfiled device data.  Last Pain:  Vitals:   11/12/22 0624  TempSrc:   PainSc: 0-No pain      Patients Stated Pain Goal: 0 (11/12/22 9518)  Complications: No notable events documented.

## 2022-11-12 NOTE — Anesthesia Postprocedure Evaluation (Signed)
Anesthesia Post Note  Patient: Annette Harris  Procedure(s) Performed: LAPAROSCOPIC CHOLECYSTECTOMY WITH INTRAOPERATIVE CHOLANGIOGRAM (Abdomen)     Patient location during evaluation: PACU Anesthesia Type: General Level of consciousness: awake Pain management: pain level controlled Vital Signs Assessment: post-procedure vital signs reviewed and stable Respiratory status: spontaneous breathing, nonlabored ventilation and respiratory function stable Cardiovascular status: blood pressure returned to baseline and stable Postop Assessment: no apparent nausea or vomiting Anesthetic complications: no   No notable events documented.  Last Vitals:  Vitals:   11/12/22 1130 11/12/22 1145  BP: (!) 106/58 122/70  Pulse: 65   Resp: 15   Temp: 36.7 C   SpO2: 95%     Last Pain:  Vitals:   11/12/22 1130  TempSrc:   PainSc: Asleep                 Linton Rump

## 2022-11-12 NOTE — Progress Notes (Signed)
50 mcg fentanyl wasted with Nanine Means, RN.

## 2022-11-12 NOTE — Discharge Instructions (Signed)
Outpatient Surgery Home Care Instruction  Activity  The effects of anesthesia are still present and drowsiness may result.  Limit activity for the first 24 hours, then you may return to normal daily activities. Returning to normal daily activities as soon as you can following surgery will enhance recovery time.  Do not drive or operate heavy machinery within 24 hours of taking narcotic pain medications.   Do not mow the lawn, use a vacuum cleaner, or do any other strenuous activities without first consulting your surgical team.   Diet Drink plenty of fluids and eat light meals today, then resume regular diet. Some patients may find their appetite is poor for a week or two after surgery. This is a normal result of the stress of surgery-your appetite will return in time.   There are no specific diet restrictions after surgery.   Dressing and Wound Care  Keep your wound or incision site clean and dry.  You may have different types of dressings covering your incisions depending on your operation and your surgeon: o Dermabond/Durabond (skin glue): This will usually remain in place for 10-14 days, then naturally fall off your skin. You may take a shower 24 hrs after surgery, carefully wash, not scrub the incision site with a mild non-scented soap. Pat dry with a soft towel.  Do not pick or peel skin glue off.  Do not use creams, powder, salves or balms on your incision(s).  What to Expect After Surgery   Moderate discomfort controlled with medications  Minimal drainage from incision  Feeling fatigue and weak  Constipation after surgery is common. Drink plenty fluids and eat a high fiber diet.   Pain Control: Prescribed Non-Narcotic Pain Medication  You will be given three prescriptions.  Two of them will be for prescription strength ibuprofen (i.e. Advil) and prescription strength acetaminophen (i.e. Tylenol).  The vast majority of patients will just need these two medications.  One  prescription will be for a 'rescue' prescription of an oral narcotic (oxycodone).  You may fill this if needed.  You will alternate taking the ibuprofen (600mg ) every 6 hours and also the acetaminophen (650mg ) every 6 hours so that you are taking one of those medications every 3 hours.  For example: o 0800 - take ibuprofen 600mg  o 1100 - take acetaminophen 650mg  o 1400 - take ibuprofen 600mg  o 1700 - take acetaminophen 650mg  o Etc.  Continue taking this alternating pattern of ibuprofen and acetaminophen for 3 days  If you cannot take one or the other of these medications, just take the one you can every 6 hours.  If you are comfortable at night, you don't have to wake up and take a medication.  If you are still uncomfortable after taking either ibuprofen or acetaminophen, try gentle stretching exercise and ice packs (a bag of frozen vegetables works great).  If you are still uncomfortable, you may fill the narcotic prescription of Oxycodone and take as directed.  Once you have completed these prescriptions, your pain level should be low enough to stop taking medications altogether or just use an over the counter medication (ibuprofen or acetaminophen) as needed.    Pain Control: Over the Counter Medications to take as needed  Colace/Docusate: May be prescribed by your surgeon to prevent constipation caused by the combination of narcotics, effects of anesthesia, and decreased ambulation.  Hold for loose stools or diarrhea. Take 100 mg 1-2 times a day starting tonight.   Fiber: High fiber foods, extra liquids (water  9-13 cups/day) can also assist with constipation. Examples of high fiber foods are fruit, bran. Prune juice and water are also good liquids to drink.  Milk of Magnesia/Miralax:  If constipated despite takeing the over the counter stool softeners, you may take Milk of Magnesia or Miralax as directed on bottle to assist with constipation.     Pepcid/Famotidine: May be prescribed while  taking naproxen (Aleve) or other NSAIDs such as ibuprofen (Motrin/Advil) to prevent stomach upset or Acid-reflux symptoms. Take 1 tablet 1-2 times a day.   **Constipation: The first bowel movement may occur anywhere between 1-5 days after surgery.  As long as you are not nauseated or not having significant abdominal pain this variation is acceptable. Narcotic pain medications can cause constipation increasing discomfort; early discontinuation will assist with bowel management. If constipated despite taking stool softeners, you may take Milk of Magnesia or Miralax as directed on the bottle.     **Home medications: You may restart your home medications as directed by your respective Primary Care Physician or Surgeon.   When to notify your Doctor or Healthcare Team   Sign of Wound Infection   Fever over 100 degrees.  Wound becomes extremely swollen, shows red streaks, warm to the touch, and/or drainage from the incision site or foul-smelling drainage.  Wound edges separate or opens up  Bleeding or bruising   If you have bleeding, apply pressure to the site and hold the pressure firmly for 5 minutes. If the bleeding continues, apply pressure again and call 911. If the bleeding stopped, call your doctor to report it.   Call your doctor or nurse if you have increased bleeding from your site and increased bruising or a lump forms or gets larger under your skin at the site.  Unrelieved Pain    Call your doctor or nurse if your pain gets worse or is not eased 1 hour after taking your pain medicine, or if it is severe and uncontrolled. Nausea and Vomiting   Call your doctor or nurse if you have nausea and vomiting that continues more than 24 hours, will not let you keep medicine down and will not let you keep fluids down  Fever, Flu-like symptoms   Fever over 100 degrees and/or chills  Gastrointestinal Bleeding Symptoms    Black tarry bowel movements.  This can be normal after surgery on the stomach,  but should resolve in a day or two.    Call 911 if you suddenly have signs of blood loss such as:  Vomiting blood  Fast heart rate  Feeling faint, sweaty, or blacking out  Passing bright red blood from your rectum  Blood Clot Symptoms   Tender, swollen or reddened areas in your calf muscle or thighs.  Numbness or tingling in your lower leg or calf, or at the top of your leg or groin  Skin on your leg looks pale or blue or feels cold to touch  Chest pain or have trouble breathing, lightheadedness, fast heart rate  Sudden Onset of Symptoms    Call 911 if you suddenly have:  Leg weakness and spasm  Loss of bladder or bowel function  Seizure  Confusion, severe headache, dizziness or feeling unsteady, problems talking, difficulty swallowing, and/or numbness or muscle weakness as these could be signs of a stroke.  Follow up Appointment Your follow up appointment should be scheduled 2-3 weeks after your surgery date.  If you have not previously scheduled for a follow-up visit you can be scheduled by  contacting [phone].

## 2022-11-12 NOTE — Anesthesia Procedure Notes (Signed)
Procedure Name: Intubation Date/Time: 11/12/2022 7:38 AM  Performed by: Linton Rump, MDPre-anesthesia Checklist: Patient identified, Emergency Drugs available, Suction available and Patient being monitored Patient Re-evaluated:Patient Re-evaluated prior to induction Oxygen Delivery Method: Circle system utilized Preoxygenation: Pre-oxygenation with 100% oxygen Induction Type: IV induction Ventilation: Mask ventilation without difficulty Laryngoscope Size: Mac and 3 Grade View: Grade I Tube type: Oral Tube size: 7.0 mm Number of attempts: 1 Airway Equipment and Method: Stylet Placement Confirmation: ETT inserted through vocal cords under direct vision, positive ETCO2 and breath sounds checked- equal and bilateral Secured at: 21 cm Tube secured with: Tape Dental Injury: Teeth and Oropharynx as per pre-operative assessment

## 2022-11-12 NOTE — H&P (Signed)
Annette Harris [birth year]  [id number].    HPI:  42 y/o F w/ a hx of symptomatic cholelithiasis who presents for elective cholecystectomy. She reports that she is in her usual state of health and denies any recent hospitalizations or changes in medications.  ROS: Review of Systems  Constitutional: Negative.   HENT: Negative.    Eyes: Negative.   Respiratory: Negative.    Cardiovascular: Negative.   Gastrointestinal: Negative.   Genitourinary: Negative.   Musculoskeletal: Negative.   Skin: Negative.   Neurological: Negative.   Endo/Heme/Allergies: Negative.   Psychiatric/Behavioral: Negative.      Family History  Problem Relation Age of Onset   Hypertension Father    Hyperlipidemia Father    Colon cancer Neg Hx     Past Medical History:  Diagnosis Date   Anemia    Iron deficiency Anemia   Anxiety    Dermatomyositis (HCC)    In remission 7-8 years (as of 11/05/2022)   Dermatomyositis (HCC)    Endometriosis    History of kidney stones    Hypothyroidism    Lymphocytic colitis    Microscopic colitis    Preterm labor    Thyroid disease     Past Surgical History:  Procedure Laterality Date   CESAREAN SECTION N/A 01/05/2020   Procedure: CESAREAN SECTION;  Surgeon: Harold Hedge, MD;  Location: [facility] LD ORS;  Service: Obstetrics;  Laterality: N/A;   laproscopy for endometrosis  2011   MUSCLE BIOPSY     TONSILLECTOMY AND ADENOIDECTOMY  1986    Social History:  reports that she has never smoked. She has never used smokeless tobacco. She reports that she does not drink alcohol and does not use drugs.  Allergies:  Allergies  Allergen Reactions   Penicillins Rash    Medications Prior to Admission  Medication Sig Dispense Refill   ASHWAGANDHA PO Take 3,000 mg by mouth daily.     Cholecalciferol (VITAMIN D) 50 MCG (2000 UT) CAPS Take 8,000 Units by mouth daily.     ferrous sulfate 325 (65 FE) MG EC tablet Take 325 mg by mouth 2 (two) times a week.      levothyroxine (SYNTHROID) 100 MCG tablet Take 100 mcg by mouth daily before breakfast.     Prenatal Vit w/Fe-Methylfol-FA (PNV PO) Take 1 tablet by mouth daily.     oxyCODONE (OXY IR/ROXICODONE) 5 MG immediate release tablet Take 1 tablet (5 mg total) by mouth every 4 (four) hours as needed for moderate pain. (Patient not taking: Reported on 10/30/2022) 30 tablet 0    Physical Exam: Blood pressure 113/71, pulse 75, temperature 98 F (36.7 C), temperature source Oral, resp. rate 17, height 5\' 5"  (1.651 m), weight 78 kg, last menstrual period 10/22/2022, SpO2 98%, unknown if currently breastfeeding. Gen: female resting in bed, NAD Resp: equal chest rise CV: RRR Abd: soft, non-distended Neuro: moving all extremities  Results for orders placed or performed during the hospital encounter of 11/12/22 (from the past 48 hour(s))  Pregnancy, urine POC     Status: None   Collection Time: 11/12/22  6:30 AM  Result Value Ref Range   Preg Test, Ur NEGATIVE NEGATIVE    Comment:        THE SENSITIVITY OF THIS METHODOLOGY IS >24 mIU/mL    No results found.  Assessment/Plan 42 y/o F w/ symptomatic cholelithiasis   - Will proceed to OR for cholecystectomy - Tentative plan for discharge from PACU  Annette Harris  [facility] 11/12/2022, 7:02 AM Please see Amion for pager number during day hours 7:00am-4:30pm or 7:00am -11:30am on weekends

## 2022-11-12 NOTE — Op Note (Signed)
Patient: Annette Harris MRN: [id number] DOB: 04/26/1980 Sex: female Operation/Procedure Date: 11/12/2022  Surgeons and Role:    * Asser Lucena, Lucilla Edin, MD - Primary  Pre-operative Diagnoses: CALCULUS OF GALLBLADDER Postoperative Diagnoses: CALCULUS OF GALLBLADDER  Procedure performed: Laparoscopic cholecystectomy with intra-operative cholangiogram  Anesthesia: General endotracheal anesthesia  Indications: Annette Harris is a 42 year old female who presents for laparoscopic cholecystectomy. Preoperatively, I discussed in detail the risks, benefits, alternatives, and potential complications. The patient understands and requests to proceed.  Operative Findings: Normal intraoperative cholangiogram.  Operative Narrative: The patient was positively identified and was taken to the operating room and placed supine on the operating table. A time-out was performed confirming correct patient and procedure. We also confirmed initiation of deep venous thrombosis prophylaxis and wound prophylaxis. After successful induction of general endotracheal anesthesia, the arms were carefully padded. An orogastric tube and footboard were placed. The abdomen was prepped and draped in the usual sterile surgical fashion.  We began our access with a Veress needle in the LUQ at Palmer's point.  Following a aspiration of air and a positive saline drop test, the insufflation tubing was connected and the abdomen brought to a pressure of . A 5-mm port was placed superior of the umbilicus using an opti-view technique. The abdomen was inspected and there were no signs of injury from access.  We placed three additional ports all under direct vision, a 10-mm port in the subxiphoid position, one 5-mm port in the right midclavicular line, and one right subcostal port in the anterior axillary line. The patient was placed in the head up position and tilted slightly to the left. The dome of the gallbladder was grasped, elevated, and  retracted anteriorly and cephalad. The infundibulum was retracted laterally and inferiorly exposing Calot's triangle. The investing visceral peritoneal attachments overlying the infundibulum of the gallbladder were dissected free from the gallbladder itself. We soon developed two structures into the gallbladder consistent with the cystic duct and cystic artery. The loose areolar tissue around these structures was dissected free. The gallbladder was separated from the gallbladder fossa for approximately a third the distance up from the cystic plate, establishing the critical view of safety. We then performed a cholangiogram using a cook catheter inserted into the cystic duct and secured with a clip.This revealed adequate length of cystic duct entering into normal caliber common bile duct. The common hepatic duct right and left ducts were normal. There was appropriate sectoral branching seen bilaterally. There was no evidence of filling defect or stricture distally. There was prompt filling of the duodenum. The catheter was then removed and the cystic duct and cystic artery triply clipped. These were divided using laparoscopic scissors leaving a single clip on the removal side. The gallbladder was elevated off the gallbladder fossa using the hook Bovie. The gallbladder was then exteriorized through the subxiphoid position using a specimen bag. We reestablished pneumoperitoneum and confirmed no leakage of blood or bile. The subhepatic space was irrigated with warm sterile saline and suctioned free. The subxiphoid port was removed and the defect closed with an interrupted 0 vicryl on a suture passer.  The other ports were removed under direct vision and the abdomen was desufflated. We placed 0.25% Marcaine plain at each incision site for local anesthesia. The skin was closed using 4-0 Monocryl subcuticular suture. Dermabond was applied. The patient tolerated the procedure well, was extubated, and taken to the recovery  room.  Estimated Blood Loss: Minimal Specimens: Gallbladder Implants: None Drains: None  Complications: None Condition of the patient: Good, extubated Disposition: PACU   Moise Boring Date: 11/12/2022 Time: 9:07 AM

## 2022-11-13 ENCOUNTER — Emergency Department (HOSPITAL_BASED_OUTPATIENT_CLINIC_OR_DEPARTMENT_OTHER): Payer: No Typology Code available for payment source

## 2022-11-13 ENCOUNTER — Encounter (HOSPITAL_COMMUNITY): Payer: Self-pay | Admitting: General Surgery

## 2022-11-13 ENCOUNTER — Inpatient Hospital Stay (HOSPITAL_BASED_OUTPATIENT_CLINIC_OR_DEPARTMENT_OTHER)
Admission: EM | Admit: 2022-11-13 | Discharge: 2022-11-17 | DRG: 358 | Disposition: A | Discharge status: Home / Self Care | Payer: No Typology Code available for payment source | Attending: General Surgery | Admitting: General Surgery

## 2022-11-13 ENCOUNTER — Other Ambulatory Visit: Payer: Self-pay

## 2022-11-13 DIAGNOSIS — K8070 Calculus of gallbladder and bile duct without cholecystitis without obstruction: Secondary | ICD-10-CM | POA: Diagnosis present

## 2022-11-13 DIAGNOSIS — Z9049 Acquired absence of other specified parts of digestive tract: Secondary | ICD-10-CM

## 2022-11-13 DIAGNOSIS — Z880 Allergy status to penicillin: Secondary | ICD-10-CM

## 2022-11-13 DIAGNOSIS — K219 Gastro-esophageal reflux disease without esophagitis: Secondary | ICD-10-CM | POA: Diagnosis present

## 2022-11-13 DIAGNOSIS — Z8249 Family history of ischemic heart disease and other diseases of the circulatory system: Secondary | ICD-10-CM

## 2022-11-13 DIAGNOSIS — Y838 Other surgical procedures as the cause of abnormal reaction of the patient, or of later complication, without mention of misadventure at the time of the procedure: Secondary | ICD-10-CM | POA: Diagnosis present

## 2022-11-13 DIAGNOSIS — K838 Other specified diseases of biliary tract: Secondary | ICD-10-CM | POA: Diagnosis present

## 2022-11-13 DIAGNOSIS — Z87442 Personal history of urinary calculi: Secondary | ICD-10-CM

## 2022-11-13 DIAGNOSIS — E039 Hypothyroidism, unspecified: Secondary | ICD-10-CM | POA: Diagnosis present

## 2022-11-13 DIAGNOSIS — D72829 Elevated white blood cell count, unspecified: Secondary | ICD-10-CM | POA: Diagnosis present

## 2022-11-13 DIAGNOSIS — Z79890 Hormone replacement therapy (postmenopausal): Secondary | ICD-10-CM

## 2022-11-13 DIAGNOSIS — E876 Hypokalemia: Secondary | ICD-10-CM | POA: Diagnosis not present

## 2022-11-13 DIAGNOSIS — K9189 Other postprocedural complications and disorders of digestive system: Principal | ICD-10-CM | POA: Diagnosis present

## 2022-11-13 DIAGNOSIS — Z8349 Family history of other endocrine, nutritional and metabolic diseases: Secondary | ICD-10-CM

## 2022-11-13 LAB — COMPREHENSIVE METABOLIC PANEL
ALT: 24 U/L (ref 0–44)
AST: 24 U/L (ref 15–41)
Albumin: 4.8 g/dL (ref 3.5–5.0)
Alkaline Phosphatase: 56 U/L (ref 38–126)
Anion gap: 10 (ref 5–15)
BUN: 8 mg/dL (ref 6–20)
CO2: 26 mmol/L (ref 22–32)
Calcium: 9.8 mg/dL (ref 8.9–10.3)
Chloride: 101 mmol/L (ref 98–111)
Creatinine, Ser: 0.78 mg/dL (ref 0.44–1.00)
GFR, Estimated: >60 mL/min (ref >60)
Glucose, Bld: 126 mg/dL — ABNORMAL HIGH (ref 70–99)
Potassium: 3.4 mmol/L — ABNORMAL LOW (ref 3.5–5.1)
Sodium: 137 mmol/L (ref 135–145)
Total Bilirubin: 1.4 mg/dL — ABNORMAL HIGH (ref 0.3–1.2)
Total Protein: 7.3 g/dL (ref 6.5–8.1)

## 2022-11-13 LAB — CBC
HCT: 37.1 % (ref 36.0–46.0)
Hemoglobin: 12.6 g/dL (ref 12.0–15.0)
MCH: 31 pg (ref 26.0–34.0)
MCHC: 34 g/dL (ref 30.0–36.0)
MCV: 91.2 fL (ref 80.0–100.0)
Platelets: 247 10*3/uL (ref 150–400)
RBC: 4.07 MIL/uL (ref 3.87–5.11)
RDW: 12.9 % (ref 11.5–15.5)
WBC: 14.3 10*3/uL — ABNORMAL HIGH (ref 4.0–10.5)
nRBC: 0 % (ref 0.0–0.2)

## 2022-11-13 LAB — LIPASE, BLOOD: Lipase: <10 U/L — ABNORMAL LOW (ref 11–51)

## 2022-11-13 LAB — SURGICAL PATHOLOGY

## 2022-11-13 MED ORDER — FENTANYL CITRATE PF 50 MCG/ML IJ SOSY
50.0000 ug | PREFILLED_SYRINGE | INTRAMUSCULAR | Status: AC | PRN
  Administered 2022-11-13 (×2): 50 ug via INTRAVENOUS
  Filled 2022-11-13 (×2): qty 1

## 2022-11-13 MED ORDER — HYDROMORPHONE HCL 1 MG/ML IJ SOLN
1.0000 mg | Freq: Once | INTRAMUSCULAR | Status: AC
  Administered 2022-11-13: 1 mg via INTRAVENOUS
  Filled 2022-11-13: qty 1

## 2022-11-13 MED ORDER — SODIUM CHLORIDE 0.9 % IV BOLUS
1000.0000 mL | Freq: Once | INTRAVENOUS | Status: AC
  Administered 2022-11-13: 1000 mL via INTRAVENOUS

## 2022-11-13 MED ORDER — IOHEXOL 300 MG/ML  SOLN
100.0000 mL | Freq: Once | INTRAMUSCULAR | Status: AC | PRN
  Administered 2022-11-13: 85 mL via INTRAVENOUS

## 2022-11-13 MED ORDER — DIPHENHYDRAMINE HCL 50 MG/ML IJ SOLN
25.0000 mg | Freq: Once | INTRAMUSCULAR | Status: AC
  Administered 2022-11-13: 25 mg via INTRAVENOUS
  Filled 2022-11-13: qty 1

## 2022-11-13 MED ORDER — ONDANSETRON HCL 4 MG/2ML IJ SOLN
4.0000 mg | Freq: Once | INTRAMUSCULAR | Status: AC | PRN
  Administered 2022-11-13: 4 mg via INTRAVENOUS
  Filled 2022-11-13: qty 2

## 2022-11-13 NOTE — ED Triage Notes (Signed)
Patient arrives with complaints of worsening abdominal pain x1 day. Patient had abd surgery yesterday on her gallbladder and she is now having 10 out of 10 pain with abdominal distension.

## 2022-11-13 NOTE — ED Notes (Signed)
Patient transported to CT 

## 2022-11-13 NOTE — ED Notes (Signed)
Pt. States she ate some jello and vomitted.

## 2022-11-13 NOTE — H&P (Signed)
Annette Harris 1981-01-28  [id number].     HPI:  Annette Harris is a 42 yo female who presented to the [facility] ED today with abdominal pain. She underwent a laparoscopic cholecystectomy yesterday by Dr. Hillery Hunter for symptomatic cholelithiasis. She had an intraoperative cholangiogram that was normal, and was discharged home postoperatively. She reports severe pain since surgery. Today she has had nausea, vomiting and PO intolerance. Labs in the ED were significant for a WBC of 14 and a Tbili of 1.4. A CT scan showed a moderate amount of perihepatic free fluid, as well as some free fluid in the pelvis. General surgery was consulted and she was directly admitted to [facility]. She continues to have severe abdominal pain, which she says is primarily in the central abdomen but radiates to the shoulders. She is not passing flatus or having bowel movements.   ROS: Review of Systems  Constitutional:  Positive for chills and malaise/fatigue. Negative for fever.  Respiratory:  Negative for shortness of breath.   Cardiovascular:  Negative for chest pain.  Gastrointestinal:  Positive for abdominal pain, constipation, nausea and vomiting.    Family History  Problem Relation Age of Onset   Hypertension Father    Hyperlipidemia Father    Colon cancer Neg Hx     Past Medical History:  Diagnosis Date   Anemia    Iron deficiency Anemia   Anxiety    Dermatomyositis (HCC)    In remission 7-8 years (as of 11/05/2022)   Dermatomyositis (HCC)    Endometriosis    History of kidney stones    Hypothyroidism    Lymphocytic colitis    Microscopic colitis    Preterm labor    Thyroid disease     Past Surgical History:  Procedure Laterality Date   CESAREAN SECTION N/A 01/05/2020   Procedure: CESAREAN SECTION;  Surgeon: Harold Hedge, MD;  Location: [facility] LD ORS;  Service: Obstetrics;  Laterality: N/A;   CHOLECYSTECTOMY N/A 11/12/2022   Procedure: LAPAROSCOPIC CHOLECYSTECTOMY WITH INTRAOPERATIVE CHOLANGIOGRAM;   Surgeon: Moise Boring, MD;  Location: [facility] OR;  Service: General;  Laterality: N/A;   laproscopy for endometrosis  2011   MUSCLE BIOPSY     TONSILLECTOMY AND ADENOIDECTOMY  1986    Social History:  reports that she has never smoked. She has never used smokeless tobacco. She reports that she does not drink alcohol and does not use drugs.  Allergies:  Allergies  Allergen Reactions   Penicillins Rash    (Not in a hospital admission)    Physical Exam: Blood pressure (!) 116/53, pulse 84, temperature (!) 97.4 F (36.3 C), resp. rate (!) 21, height 5\' 5"  (1.651 m), weight 78 kg, last menstrual period 10/22/2022, SpO2 99%, unknown if currently breastfeeding. General: resting in bed, appears uncomfortable Neurological: alert and oriented, no focal deficits HEENT: normocephalic, atraumatic, no scleral icterus Respiratory: normal work of breathing on room air Abdomen: soft, nondistended, tender to palpation, worse in epigastric area. Incisions are clean and dry with no erythema or induration. Extremities: warm and well-perfused, no deformities, moving all extremities spontaneously Psychiatric: normal mood and affect Skin: warm and dry, no jaundice, no rashes or lesions   Results for orders placed or performed during the hospital encounter of 11/13/22 (from the past 48 hour(s))  Lipase, blood     Status: Abnormal   Collection Time: 11/13/22  4:06 PM  Result Value Ref Range   Lipase <10 (L) 11 - 51 U/L    Comment: Performed at [facility], [address] [place], [place], [place] [address]  Comprehensive metabolic panel     Status: Abnormal   Collection Time: 11/13/22  4:06 PM  Result Value Ref Range   Sodium 137 135 - 145 mmol/L   Potassium 3.4 (L) 3.5 - 5.1 mmol/L   Chloride 101 98 - 111 mmol/L   CO2 26 22 - 32 mmol/L   Glucose, Bld 126 (H) 70 - 99 mg/dL    Comment: Glucose reference range applies only to samples taken after fasting for at least 8 hours.   BUN 8 6  - 20 mg/dL   Creatinine, Ser 6.04 0.44 - 1.00 mg/dL   Calcium 9.8 8.9 - 54.0 mg/dL   Total Protein 7.3 6.5 - 8.1 g/dL   Albumin 4.8 3.5 - 5.0 g/dL   AST 24 15 - 41 U/L   ALT 24 0 - 44 U/L   Alkaline Phosphatase 56 38 - 126 U/L   Total Bilirubin 1.4 (H) 0.3 - 1.2 mg/dL   GFR, Estimated >98 >11 mL/min    Comment: (NOTE) Calculated using the CKD-EPI Creatinine Equation (2021)    Anion gap 10 5 - 15    Comment: Performed at [facility], [address] [place], [place] [address]  CBC     Status: Abnormal   Collection Time: 11/13/22  4:06 PM  Result Value Ref Range   WBC 14.3 (H) 4.0 - 10.5 K/uL   RBC 4.07 3.87 - 5.11 MIL/uL   Hemoglobin 12.6 12.0 - 15.0 g/dL   HCT 29.5 62.1 - 30.8 %   [name] 91.2 80.0 - 100.0 fL   MCH 31.0 26.0 - 34.0 pg   MCHC 34.0 30.0 - 36.0 g/dL   RDW 65.7 84.6 - 96.2 %   Platelets 247 150 - 400 K/uL   nRBC 0.0 0.0 - 0.2 %    Comment: Performed at [facility], [address] [place], [place] [address]   CT ABDOMEN PELVIS W CONTRAST  Result Date: 11/13/2022 CLINICAL DATA:  Postop abdomen pain, gallbladder surgery yesterday EXAM: CT ABDOMEN AND PELVIS WITH CONTRAST TECHNIQUE: Multidetector CT imaging of the abdomen and pelvis was performed using the standard protocol following bolus administration of intravenous contrast. RADIATION DOSE REDUCTION: This exam was performed according to the departmental dose-optimization program which includes automated exposure control, adjustment of the mA and/or kV according to patient size and/or use of iterative reconstruction technique. CONTRAST:  85mL OMNIPAQUE IOHEXOL 300 MG/ML  SOLN COMPARISON:  CT 10/10/2022, cholangiogram 11/12/2022 FINDINGS: Lower chest: Lung bases demonstrate no acute airspace disease. Hepatobiliary: Status post cholecystectomy. No biliary dilatation. Moderate perihepatic fluid, extending along the right gutter. Small amount of gas within the fluid collection anteriorly  Pancreas: Unremarkable. No pancreatic ductal dilatation or surrounding inflammatory changes. Spleen: Normal in size without focal abnormality. Adrenals/Urinary Tract: Adrenal glands are unremarkable. Kidneys are normal, without renal calculi, focal lesion, or hydronephrosis. Bladder is unremarkable. Stomach/Bowel: Stomach is within normal limits. No evidence of bowel wall thickening, distention, or inflammatory changes. Vascular/Lymphatic: No significant vascular findings are present. No enlarged abdominal or pelvic lymph nodes. Reproductive: Uterus and bilateral adnexa are unremarkable. Other: Moderate volume fluid in the pelvis. Scattered foci of free air likely postoperative. Small foci of gas within soft tissues of the abdominal wall consistent with recent surgery. Musculoskeletal: No acute osseous abnormality IMPRESSION: 1. Interval cholecystectomy. Moderate perihepatic free fluid and moderate free fluid in the pelvis, this is somewhat greater than would be expected for normal postoperative fluid  and raises concern for potential biliary leak. Recommend correlation with nuclear medicine hepatobiliary imaging. 2. Scattered foci of free intraperitoneal gas consistent with recent surgery. Electronically Signed   By: Jasmine Pang M.D.   On: 11/13/2022 17:35   DG Cholangiogram Operative  Result Date: 11/12/2022 CLINICAL DATA:  Intraoperative cholangiogram during laparoscopic cholecystectomy. EXAM: INTRAOPERATIVE CHOLANGIOGRAM FLUOROSCOPY TIME:  6 seconds (1 mGy) COMPARISON:  None Available. FINDINGS: Intraoperative cholangiographic images of the right upper abdominal quadrant during laparoscopic cholecystectomy are provided for review. Surgical clips overlie the expected location of the gallbladder fossa. Contrast injection demonstrates selective cannulation of the central aspect of the cystic duct. There is minimal extravasation of contrast about the cystic duct cannulation site. There is passage of contrast  through the central aspect of the cystic duct with filling of a non dilated common bile duct. There is passage of contrast though the CBD and into the descending portion of the duodenum. There is minimal reflux of injected contrast into the common hepatic duct and central aspect of the non dilated intrahepatic biliary system. There are no discrete filling defects within the opacified portions of the biliary system to suggest the presence of choledocholithiasis. IMPRESSION: No evidence of choledocholithiasis. Electronically Signed   By: Simonne Come M.D.   On: 11/12/2022 12:54      Assessment/Plan 42 yo female presenting one day after elective laparoscopic cholecystectomy with severe abdominal pain, leukocytosis, mild hyperbilirubinemia and perihepatic free fluid. Constellation of findings is most consistent with a postoperative bile leak. I reviewed her intraoperative cholangiogram, which shows normal filling of the entire extrahepatic biliary tree and a long cystic duct, thus I feel that a common bile duct injury is very unlikely. This is most likely a cystic duct stump leak.  - Obtain HIDA scan to confirm diagnosis. If bile leak is confirmed, may need a laparoscopic washout with drain placement. - NPO except ice chips and sips of clears - Pain and nausea control - Trend LFTs - VTE: lovenox, SCDs - Dispo: admit to inpatient, med-surg floor   Sophronia Simas, MD [facility] General, Hepatobiliary and Pancreatic Surgery 11/13/22 7:37 PM

## 2022-11-13 NOTE — ED Provider Notes (Signed)
[place] EMERGENCY DEPARTMENT AT [facility] Provider Note   CSN: [id number] Arrival date & time: 11/13/22  1544     History  Chief Complaint  Patient presents with   Abdominal Pain   Post-op Problem    Annette Harris is a 42 y.o. female, history of cholelithiasis, GERD, who presents to the ED secondary to severe abdominal pain, this been going on for the last day.  She states she had her gallbladder taken out yesterday, and that her pain was uncontrolled, at discharge yesterday.  She notes that she has not been able to eat or drink anything, and she feels like her belly is a lot fuller than usual.  Has not had any kind of bowel movement, and has not been passing any gas.  Last bowel movement was prior to the surgery.  Denies any fevers or chills.  Called her surgeon's office today and was told to come to the ER.     Home Medications Prior to Admission medications   Medication Sig Start Date End Date Taking? Authorizing Provider  acetaminophen (TYLENOL) 325 MG tablet Take 2 tablets (650 mg total) by mouth every 4 (four) hours as needed for up to 6 days. 11/12/22 11/18/22  Moise Boring, MD  ASHWAGANDHA PO Take 3,000 mg by mouth daily.    [provider]  Cholecalciferol (VITAMIN D) 50 MCG (2000 UT) CAPS Take 8,000 Units by mouth daily.    [provider]  ferrous sulfate 325 (65 FE) MG EC tablet Take 325 mg by mouth 2 (two) times a week.    [provider]  ibuprofen (MOTRIN IB) 200 MG tablet Take 3 tablets (600 mg total) by mouth every 6 (six) hours as needed for up to 6 days. 11/12/22 11/18/22  Moise Boring, MD  levothyroxine (SYNTHROID) 100 MCG tablet Take 100 mcg by mouth daily before breakfast.    [provider]  oxyCODONE (OXY IR/ROXICODONE) 5 MG immediate release tablet Take 1 tablet (5 mg total) by mouth every 6 (six) hours as needed for moderate pain. 11/12/22   Moise Boring, MD  Prenatal Vit w/Fe-Methylfol-FA (PNV  PO) Take 1 tablet by mouth daily.    [provider]      Allergies    Penicillins    Review of Systems   Review of Systems  Gastrointestinal:  Positive for abdominal pain, nausea and vomiting. Negative for diarrhea.    Physical Exam Updated Vital Signs BP 129/76   Pulse 93   Temp (!) 97.4 F (36.3 C)   Resp (!) 22   Ht 5\' 5"  (1.651 m)   Wt 78 kg   LMP 10/22/2022 (Approximate) Comment: neg preg test 11/12/22  SpO2 99%   BMI 28.62 kg/m  Physical Exam Vitals and nursing note reviewed.  Constitutional:      General: She is not in acute distress.    Appearance: She is well-developed.  HENT:     Head: Normocephalic and atraumatic.  Eyes:     Conjunctiva/sclera: Conjunctivae normal.  Cardiovascular:     Rate and Rhythm: Normal rate and regular rhythm.     Heart sounds: No murmur heard. Pulmonary:     Effort: Pulmonary effort is normal. No respiratory distress.     Breath sounds: Normal breath sounds.  Abdominal:     General: Bowel sounds are decreased.     Palpations: Abdomen is soft.     Tenderness: There is generalized abdominal tenderness.     Comments: +  mildly distended, laying on R side  Musculoskeletal:        General: No swelling.     Cervical back: Neck supple.  Skin:    General: Skin is warm and dry.     Capillary Refill: Capillary refill takes less than 2 seconds.  Neurological:     Mental Status: She is alert.  Psychiatric:        Mood and Affect: Mood normal.     ED Results / Procedures / Treatments   Labs (all labs ordered are listed, but only abnormal results are displayed) Labs Reviewed  LIPASE, BLOOD - Abnormal; Notable for the following components:      Result Value   Lipase <10 (*)    All other components within normal limits  COMPREHENSIVE METABOLIC PANEL - Abnormal; Notable for the following components:   Potassium 3.4 (*)    Glucose, Bld 126 (*)    Total Bilirubin 1.4 (*)    All other components within normal limits  CBC -  Abnormal; Notable for the following components:   WBC 14.3 (*)    All other components within normal limits    EKG None  Radiology CT ABDOMEN PELVIS W CONTRAST  Result Date: 11/13/2022 CLINICAL DATA:  Postop abdomen pain, gallbladder surgery yesterday EXAM: CT ABDOMEN AND PELVIS WITH CONTRAST TECHNIQUE: Multidetector CT imaging of the abdomen and pelvis was performed using the standard protocol following bolus administration of intravenous contrast. RADIATION DOSE REDUCTION: This exam was performed according to the departmental dose-optimization program which includes automated exposure control, adjustment of the mA and/or kV according to patient size and/or use of iterative reconstruction technique. CONTRAST:  85mL OMNIPAQUE IOHEXOL 300 MG/ML  SOLN COMPARISON:  CT 10/10/2022, cholangiogram 11/12/2022 FINDINGS: Lower chest: Lung bases demonstrate no acute airspace disease. Hepatobiliary: Status post cholecystectomy. No biliary dilatation. Moderate perihepatic fluid, extending along the right gutter. Annica Marinello amount of gas within the fluid collection anteriorly Pancreas: Unremarkable. No pancreatic ductal dilatation or surrounding inflammatory changes. Spleen: Normal in size without focal abnormality. Adrenals/Urinary Tract: Adrenal glands are unremarkable. Kidneys are normal, without renal calculi, focal lesion, or hydronephrosis. Bladder is unremarkable. Stomach/Bowel: Stomach is within normal limits. No evidence of bowel wall thickening, distention, or inflammatory changes. Vascular/Lymphatic: No significant vascular findings are present. No enlarged abdominal or pelvic lymph nodes. Reproductive: Uterus and bilateral adnexa are unremarkable. Other: Moderate volume fluid in the pelvis. Scattered foci of free air likely postoperative. Anjulie Dipierro foci of gas within soft tissues of the abdominal wall consistent with recent surgery. Musculoskeletal: No acute osseous abnormality IMPRESSION: 1. Interval  cholecystectomy. Moderate perihepatic free fluid and moderate free fluid in the pelvis, this is somewhat greater than would be expected for normal postoperative fluid and raises concern for potential biliary leak. Recommend correlation with nuclear medicine hepatobiliary imaging. 2. Scattered foci of free intraperitoneal gas consistent with recent surgery. Electronically Signed   By: Jasmine Pang M.D.   On: 11/13/2022 17:35   DG Cholangiogram Operative  Result Date: 11/12/2022 CLINICAL DATA:  Intraoperative cholangiogram during laparoscopic cholecystectomy. EXAM: INTRAOPERATIVE CHOLANGIOGRAM FLUOROSCOPY TIME:  6 seconds (1 mGy) COMPARISON:  None Available. FINDINGS: Intraoperative cholangiographic images of the right upper abdominal quadrant during laparoscopic cholecystectomy are provided for review. Surgical clips overlie the expected location of the gallbladder fossa. Contrast injection demonstrates selective cannulation of the central aspect of the cystic duct. There is minimal extravasation of contrast about the cystic duct cannulation site. There is passage of contrast through the central aspect of the cystic  duct with filling of a non dilated common bile duct. There is passage of contrast though the CBD and into the descending portion of the duodenum. There is minimal reflux of injected contrast into the common hepatic duct and central aspect of the non dilated intrahepatic biliary system. There are no discrete filling defects within the opacified portions of the biliary system to suggest the presence of choledocholithiasis. IMPRESSION: No evidence of choledocholithiasis. Electronically Signed   By: Simonne Come M.D.   On: 11/12/2022 12:54    Procedures Procedures    Medications Ordered in ED Medications  diphenhydrAMINE (BENADRYL) injection 25 mg (has no administration in time range)  fentaNYL (SUBLIMAZE) injection 50 mcg (50 mcg Intravenous Given 11/13/22 1644)  ondansetron (ZOFRAN) injection 4  mg (4 mg Intravenous Given 11/13/22 1615)  sodium chloride 0.9 % bolus 1,000 mL (0 mLs Intravenous Stopped 11/13/22 1804)  iohexol (OMNIPAQUE) 300 MG/ML solution 100 mL (85 mLs Intravenous Contrast Given 11/13/22 1706)  HYDROmorphone (DILAUDID) injection 1 mg (1 mg Intravenous Given 11/13/22 1733)    ED Course/ Medical Decision Making/ A&P                                 Medical Decision Making Patient is a 42 year old female, here for diffuse abdominal pain, that started after she had her cholecystectomy yesterday.  She has not been able to tolerate p.o. intake since then, and complains of severe pain.  States she was complaining of severe pain, upon discharge, but just asked to be discharged, because it was not getting better with the Dilaudid.  She is acutely uncomfortable on my exam, and laying on her right side and will not lay on her back.  We will obtain a CT abdomen pelvis, do eval for any postop complications as well as blood work and give her pain medication  Amount and/or Complexity of Data Reviewed Labs: ordered.    Details: Bilirubin of 1.4, leukocytosis of 14.3 K Radiology: ordered.    Details: CT abdomen pelvis, shows moderate perihepatic free fluid and moderate free fluid in the pelvis, concern for biliary leak Discussion of management or test interpretation with external provider(s): I spoke with Dr. Freida Busman, surgeon on-call, and she states that she will accept and admit the patient for postoperative complications.  Patient admitted for likely surgical evaluation, and management for postop complications.  Risk Prescription drug management.    Final Clinical Impression(s) / ED Diagnoses Final diagnoses:  Biliary anastomotic leak  Other postoperative complication involving digestive system    Rx / DC Orders ED Discharge Orders     None         Zhania Shaheen, [place], PA 11/13/22 1821    Benjiman Core, MD 11/13/22 2310

## 2022-11-13 NOTE — ED Notes (Signed)
Pt ambulated to and from restroom with assistance by family

## 2022-11-14 ENCOUNTER — Encounter (HOSPITAL_COMMUNITY)
Admission: EM | Disposition: A | Discharge status: Home / Self Care | Payer: Self-pay | Source: Home / Self Care | Attending: General Surgery

## 2022-11-14 ENCOUNTER — Observation Stay (HOSPITAL_COMMUNITY): Payer: No Typology Code available for payment source

## 2022-11-14 ENCOUNTER — Inpatient Hospital Stay (HOSPITAL_COMMUNITY): Payer: No Typology Code available for payment source | Admitting: Anesthesiology

## 2022-11-14 ENCOUNTER — Other Ambulatory Visit: Payer: Self-pay

## 2022-11-14 DIAGNOSIS — Z880 Allergy status to penicillin: Secondary | ICD-10-CM | POA: Diagnosis not present

## 2022-11-14 DIAGNOSIS — K8070 Calculus of gallbladder and bile duct without cholecystitis without obstruction: Secondary | ICD-10-CM | POA: Diagnosis present

## 2022-11-14 DIAGNOSIS — K8020 Calculus of gallbladder without cholecystitis without obstruction: Secondary | ICD-10-CM | POA: Diagnosis not present

## 2022-11-14 DIAGNOSIS — E039 Hypothyroidism, unspecified: Secondary | ICD-10-CM | POA: Diagnosis present

## 2022-11-14 DIAGNOSIS — Z8249 Family history of ischemic heart disease and other diseases of the circulatory system: Secondary | ICD-10-CM | POA: Diagnosis not present

## 2022-11-14 DIAGNOSIS — D72829 Elevated white blood cell count, unspecified: Secondary | ICD-10-CM | POA: Diagnosis present

## 2022-11-14 DIAGNOSIS — Z79890 Hormone replacement therapy (postmenopausal): Secondary | ICD-10-CM | POA: Diagnosis not present

## 2022-11-14 DIAGNOSIS — R109 Unspecified abdominal pain: Secondary | ICD-10-CM | POA: Diagnosis present

## 2022-11-14 DIAGNOSIS — K9189 Other postprocedural complications and disorders of digestive system: Secondary | ICD-10-CM | POA: Diagnosis present

## 2022-11-14 DIAGNOSIS — E876 Hypokalemia: Secondary | ICD-10-CM | POA: Diagnosis not present

## 2022-11-14 DIAGNOSIS — Y838 Other surgical procedures as the cause of abnormal reaction of the patient, or of later complication, without mention of misadventure at the time of the procedure: Secondary | ICD-10-CM | POA: Diagnosis present

## 2022-11-14 DIAGNOSIS — K219 Gastro-esophageal reflux disease without esophagitis: Secondary | ICD-10-CM | POA: Diagnosis present

## 2022-11-14 DIAGNOSIS — Z9049 Acquired absence of other specified parts of digestive tract: Secondary | ICD-10-CM

## 2022-11-14 DIAGNOSIS — Z87442 Personal history of urinary calculi: Secondary | ICD-10-CM | POA: Diagnosis not present

## 2022-11-14 DIAGNOSIS — Z8349 Family history of other endocrine, nutritional and metabolic diseases: Secondary | ICD-10-CM | POA: Diagnosis not present

## 2022-11-14 HISTORY — PX: LAPAROSCOPY: SHX197

## 2022-11-14 LAB — HIV ANTIBODY (ROUTINE TESTING W REFLEX): HIV Screen 4th Generation wRfx: NONREACTIVE

## 2022-11-14 LAB — COMPREHENSIVE METABOLIC PANEL
ALT: 25 U/L (ref 0–44)
AST: 26 U/L (ref 15–41)
Albumin: 3.4 g/dL — ABNORMAL LOW (ref 3.5–5.0)
Alkaline Phosphatase: 55 U/L (ref 38–126)
Anion gap: 9 (ref 5–15)
BUN: 5 mg/dL — ABNORMAL LOW (ref 6–20)
CO2: 26 mmol/L (ref 22–32)
Calcium: 8.9 mg/dL (ref 8.9–10.3)
Chloride: 104 mmol/L (ref 98–111)
Creatinine, Ser: 0.84 mg/dL (ref 0.44–1.00)
GFR, Estimated: >60 mL/min (ref >60)
Glucose, Bld: 100 mg/dL — ABNORMAL HIGH (ref 70–99)
Potassium: 3.2 mmol/L — ABNORMAL LOW (ref 3.5–5.1)
Sodium: 139 mmol/L (ref 135–145)
Total Bilirubin: 1.4 mg/dL — ABNORMAL HIGH (ref 0.3–1.2)
Total Protein: 6.2 g/dL — ABNORMAL LOW (ref 6.5–8.1)

## 2022-11-14 LAB — CBC
HCT: 33.4 % — ABNORMAL LOW (ref 36.0–46.0)
Hemoglobin: 10.9 g/dL — ABNORMAL LOW (ref 12.0–15.0)
MCH: 30.3 pg (ref 26.0–34.0)
MCHC: 32.6 g/dL (ref 30.0–36.0)
MCV: 92.8 fL (ref 80.0–100.0)
Platelets: 196 10*3/uL (ref 150–400)
RBC: 3.6 MIL/uL — ABNORMAL LOW (ref 3.87–5.11)
RDW: 13 % (ref 11.5–15.5)
WBC: 9.2 10*3/uL (ref 4.0–10.5)
nRBC: 0 % (ref 0.0–0.2)

## 2022-11-14 SURGERY — LAPAROSCOPY, DIAGNOSTIC
Anesthesia: General

## 2022-11-14 MED ORDER — HYDROMORPHONE HCL 1 MG/ML IJ SOLN
INTRAMUSCULAR | Status: DC | PRN
Start: 2022-11-14 — End: 2022-11-14
  Administered 2022-11-14: .5 mg via INTRAVENOUS

## 2022-11-14 MED ORDER — PROPOFOL 10 MG/ML IV BOLUS
INTRAVENOUS | Status: DC | PRN
  Administered 2022-11-14: 200 mg via INTRAVENOUS

## 2022-11-14 MED ORDER — ONDANSETRON HCL 4 MG/2ML IJ SOLN
INTRAMUSCULAR | Status: DC | PRN
  Administered 2022-11-14: 4 mg via INTRAVENOUS

## 2022-11-14 MED ORDER — DIPHENHYDRAMINE HCL 25 MG PO CAPS: 25.0000 mg | ORAL_CAPSULE | Freq: Four times a day (QID) | ORAL | Status: DC | PRN

## 2022-11-14 MED ORDER — FENTANYL CITRATE (PF) 100 MCG/2ML IJ SOLN
INTRAMUSCULAR | Status: AC
  Filled 2022-11-14: qty 2

## 2022-11-14 MED ORDER — FENTANYL CITRATE (PF) 250 MCG/5ML IJ SOLN
INTRAMUSCULAR | Status: DC | PRN
  Administered 2022-11-14: 100 ug via INTRAVENOUS

## 2022-11-14 MED ORDER — ACETAMINOPHEN 500 MG PO TABS
1000.0000 mg | ORAL_TABLET | Freq: Four times a day (QID) | ORAL | Status: DC
  Administered 2022-11-14 – 2022-11-17 (×11): 1000 mg via ORAL
  Filled 2022-11-14 (×13): qty 2

## 2022-11-14 MED ORDER — HYDROMORPHONE HCL 1 MG/ML IJ SOLN
1.0000 mg | INTRAMUSCULAR | Status: DC | PRN
  Administered 2022-11-14 – 2022-11-16 (×11): 1 mg via INTRAVENOUS
  Filled 2022-11-14 (×12): qty 1

## 2022-11-14 MED ORDER — KETAMINE HCL 50 MG/5ML IJ SOSY
PREFILLED_SYRINGE | INTRAMUSCULAR | Status: AC
  Filled 2022-11-14: qty 5

## 2022-11-14 MED ORDER — ROCURONIUM BROMIDE 10 MG/ML (PF) SYRINGE
PREFILLED_SYRINGE | INTRAVENOUS | Status: DC | PRN
  Administered 2022-11-14: 20 mg via INTRAVENOUS
  Administered 2022-11-14: 30 mg via INTRAVENOUS

## 2022-11-14 MED ORDER — CEFAZOLIN SODIUM-DEXTROSE 2-3 GM-%(50ML) IV SOLR
INTRAVENOUS | Status: DC | PRN
Start: 2022-11-14 — End: 2022-11-14
  Administered 2022-11-14: 2 g via INTRAVENOUS

## 2022-11-14 MED ORDER — SODIUM CHLORIDE 0.9 % IV SOLN
12.5000 mg | INTRAVENOUS | Status: DC | PRN
  Filled 2022-11-14: qty 0.5

## 2022-11-14 MED ORDER — LIDOCAINE 2% (20 MG/ML) 5 ML SYRINGE
INTRAMUSCULAR | Status: DC | PRN
  Administered 2022-11-14: 100 mg via INTRAVENOUS

## 2022-11-14 MED ORDER — CIPROFLOXACIN IN D5W 400 MG/200ML IV SOLN
400.0000 mg | Freq: Two times a day (BID) | INTRAVENOUS | Status: DC
  Administered 2022-11-15 – 2022-11-17 (×5): 400 mg via INTRAVENOUS
  Filled 2022-11-14 (×6): qty 200

## 2022-11-14 MED ORDER — ONDANSETRON HCL 4 MG/2ML IJ SOLN: 4.0000 mg | Freq: Four times a day (QID) | INTRAMUSCULAR | Status: DC | PRN

## 2022-11-14 MED ORDER — LACTATED RINGERS IV SOLN
INTRAVENOUS | Status: DC | PRN
Start: 2022-11-14 — End: 2022-11-14

## 2022-11-14 MED ORDER — MIDAZOLAM HCL 2 MG/2ML IJ SOLN
INTRAMUSCULAR | Status: AC
  Filled 2022-11-14: qty 2

## 2022-11-14 MED ORDER — POTASSIUM CHLORIDE 10 MEQ/100ML IV SOLN
10.0000 meq | INTRAVENOUS | Status: AC
  Administered 2022-11-14 – 2022-11-15 (×5): 10 meq via INTRAVENOUS
  Filled 2022-11-14 (×6): qty 100

## 2022-11-14 MED ORDER — SUCCINYLCHOLINE CHLORIDE 200 MG/10ML IV SOSY
PREFILLED_SYRINGE | INTRAVENOUS | Status: DC | PRN
  Administered 2022-11-14: 120 mg via INTRAVENOUS

## 2022-11-14 MED ORDER — FENTANYL CITRATE (PF) 100 MCG/2ML IJ SOLN
25.0000 ug | INTRAMUSCULAR | Status: DC | PRN
  Administered 2022-11-14: 25 ug via INTRAVENOUS

## 2022-11-14 MED ORDER — KETOROLAC TROMETHAMINE 30 MG/ML IJ SOLN
15.0000 mg | Freq: Four times a day (QID) | INTRAMUSCULAR | Status: DC | PRN
  Administered 2022-11-14 – 2022-11-16 (×3): 15 mg via INTRAVENOUS
  Filled 2022-11-14 (×3): qty 1

## 2022-11-14 MED ORDER — BUPIVACAINE-EPINEPHRINE 0.25% -1:200000 IJ SOLN
INTRAMUSCULAR | Status: DC | PRN
  Administered 2022-11-14: 30 mL

## 2022-11-14 MED ORDER — KETOROLAC TROMETHAMINE 15 MG/ML IJ SOLN
15.0000 mg | Freq: Three times a day (TID) | INTRAMUSCULAR | Status: DC
  Administered 2022-11-14 – 2022-11-15 (×4): 15 mg via INTRAVENOUS
  Filled 2022-11-14 (×3): qty 1

## 2022-11-14 MED ORDER — OXYCODONE HCL 5 MG PO TABS: 5.0000 mg | ORAL_TABLET | Freq: Once | ORAL | Status: DC | PRN

## 2022-11-14 MED ORDER — SODIUM CHLORIDE 0.9 % IV SOLN
12.5000 mg | INTRAVENOUS | Status: DC | PRN
  Administered 2022-11-14: 12.5 mg via INTRAVENOUS
  Filled 2022-11-14: qty 12.5

## 2022-11-14 MED ORDER — HYDROMORPHONE HCL 1 MG/ML IJ SOLN
INTRAMUSCULAR | Status: AC
  Filled 2022-11-14: qty 0.5

## 2022-11-14 MED ORDER — PROPOFOL 10 MG/ML IV BOLUS
INTRAVENOUS | Status: AC
  Filled 2022-11-14: qty 20

## 2022-11-14 MED ORDER — ONDANSETRON 4 MG PO TBDP: 4.0000 mg | ORAL_TABLET | Freq: Four times a day (QID) | ORAL | Status: DC | PRN

## 2022-11-14 MED ORDER — HYDROMORPHONE HCL 1 MG/ML IJ SOLN
1.0000 mg | Freq: Once | INTRAMUSCULAR | Status: AC
  Administered 2022-11-14: 1 mg via INTRAVENOUS
  Filled 2022-11-14: qty 1

## 2022-11-14 MED ORDER — DEXAMETHASONE SODIUM PHOSPHATE 10 MG/ML IJ SOLN
INTRAMUSCULAR | Status: DC | PRN
  Administered 2022-11-14: 10 mg via INTRAVENOUS

## 2022-11-14 MED ORDER — OXYCODONE HCL 5 MG/5ML PO SOLN: 5.0000 mg | Freq: Once | ORAL | Status: DC | PRN

## 2022-11-14 MED ORDER — AMISULPRIDE (ANTIEMETIC) 5 MG/2ML IV SOLN
INTRAVENOUS | Status: AC
  Filled 2022-11-14: qty 4

## 2022-11-14 MED ORDER — METHOCARBAMOL 1000 MG/10ML IJ SOLN
1000.0000 mg | Freq: Three times a day (TID) | INTRAVENOUS | Status: DC
  Administered 2022-11-14 (×2): 1000 mg via INTRAVENOUS
  Filled 2022-11-14: qty 10
  Filled 2022-11-14 (×2): qty 1000
  Filled 2022-11-14 (×4): qty 10

## 2022-11-14 MED ORDER — ACETAMINOPHEN 10 MG/ML IV SOLN
INTRAVENOUS | Status: DC | PRN
Start: 2022-11-14 — End: 2022-11-14
  Administered 2022-11-14: 1000 mg via INTRAVENOUS

## 2022-11-14 MED ORDER — SIMETHICONE 80 MG PO CHEW
80.0000 mg | CHEWABLE_TABLET | Freq: Four times a day (QID) | ORAL | Status: DC | PRN
  Administered 2022-11-15: 80 mg via ORAL
  Filled 2022-11-14: qty 1

## 2022-11-14 MED ORDER — OXYCODONE HCL 5 MG PO TABS
5.0000 mg | ORAL_TABLET | ORAL | Status: DC | PRN
  Administered 2022-11-14 – 2022-11-15 (×2): 10 mg via ORAL
  Filled 2022-11-14 (×3): qty 2

## 2022-11-14 MED ORDER — KETAMINE HCL 10 MG/ML IJ SOLN
INTRAMUSCULAR | Status: DC | PRN
Start: 2022-11-14 — End: 2022-11-14
  Administered 2022-11-14: 30 mg via INTRAVENOUS

## 2022-11-14 MED ORDER — DIPHENHYDRAMINE HCL 50 MG/ML IJ SOLN: 25.0000 mg | Freq: Four times a day (QID) | INTRAMUSCULAR | Status: DC | PRN

## 2022-11-14 MED ORDER — AMISULPRIDE (ANTIEMETIC) 5 MG/2ML IV SOLN
10.0000 mg | Freq: Once | INTRAVENOUS | Status: AC | PRN
  Administered 2022-11-14: 10 mg via INTRAVENOUS

## 2022-11-14 MED ORDER — TECHNETIUM TC 99M MEBROFENIN IV KIT
5.2000 | PACK | Freq: Once | INTRAVENOUS | Status: AC | PRN
  Administered 2022-11-14: 5.2 via INTRAVENOUS

## 2022-11-14 MED ORDER — FENTANYL CITRATE (PF) 250 MCG/5ML IJ SOLN
INTRAMUSCULAR | Status: AC
  Filled 2022-11-14: qty 5

## 2022-11-14 MED ORDER — BUPIVACAINE-EPINEPHRINE (PF) 0.25% -1:200000 IJ SOLN
INTRAMUSCULAR | Status: AC
  Filled 2022-11-14: qty 30

## 2022-11-14 MED ORDER — METRONIDAZOLE 500 MG/100ML IV SOLN
500.0000 mg | Freq: Once | INTRAVENOUS | Status: AC
  Administered 2022-11-14: 500 mg via INTRAVENOUS
  Filled 2022-11-14: qty 100

## 2022-11-14 MED ORDER — PHENYLEPHRINE 80 MCG/ML (10ML) SYRINGE FOR IV PUSH (FOR BLOOD PRESSURE SUPPORT)
PREFILLED_SYRINGE | INTRAVENOUS | Status: DC | PRN
  Administered 2022-11-14: 80 ug via INTRAVENOUS
  Administered 2022-11-14: 160 ug via INTRAVENOUS

## 2022-11-14 MED ORDER — MIDAZOLAM HCL 2 MG/2ML IJ SOLN
INTRAMUSCULAR | Status: DC | PRN
  Administered 2022-11-14: 2 mg via INTRAVENOUS

## 2022-11-14 MED ORDER — HYDROMORPHONE HCL 1 MG/ML IJ SOLN
0.5000 mg | INTRAMUSCULAR | Status: DC | PRN
  Administered 2022-11-14 (×2): 0.5 mg via INTRAVENOUS
  Filled 2022-11-14 (×2): qty 0.5

## 2022-11-14 MED ORDER — KETOROLAC TROMETHAMINE 30 MG/ML IJ SOLN
INTRAMUSCULAR | Status: AC
  Filled 2022-11-14: qty 1

## 2022-11-14 MED ORDER — ENOXAPARIN SODIUM 40 MG/0.4ML IJ SOSY
40.0000 mg | PREFILLED_SYRINGE | INTRAMUSCULAR | Status: DC
  Administered 2022-11-14 – 2022-11-16 (×3): 40 mg via SUBCUTANEOUS
  Filled 2022-11-14 (×4): qty 0.4

## 2022-11-14 MED ORDER — METHOCARBAMOL 1000 MG/10ML IJ SOLN: 500.0000 mg | Freq: Four times a day (QID) | INTRAVENOUS | Status: DC | PRN

## 2022-11-14 MED ORDER — SUGAMMADEX SODIUM 200 MG/2ML IV SOLN
INTRAVENOUS | Status: DC | PRN
  Administered 2022-11-14: 200 mg via INTRAVENOUS

## 2022-11-14 MED ORDER — CIPROFLOXACIN IN D5W 400 MG/200ML IV SOLN
400.0000 mg | Freq: Once | INTRAVENOUS | Status: AC
  Administered 2022-11-14: 400 mg via INTRAVENOUS
  Filled 2022-11-14: qty 200

## 2022-11-14 MED ORDER — METRONIDAZOLE 500 MG/100ML IV SOLN
500.0000 mg | Freq: Two times a day (BID) | INTRAVENOUS | Status: DC
  Administered 2022-11-15 – 2022-11-16 (×4): 500 mg via INTRAVENOUS
  Filled 2022-11-14 (×5): qty 100

## 2022-11-14 MED ORDER — ACETAMINOPHEN 10 MG/ML IV SOLN
INTRAVENOUS | Status: AC
  Filled 2022-11-14: qty 100

## 2022-11-14 MED ORDER — LEVOTHYROXINE SODIUM 100 MCG PO TABS
100.0000 ug | ORAL_TABLET | Freq: Every day | ORAL | Status: DC
  Administered 2022-11-14 – 2022-11-17 (×4): 100 ug via ORAL
  Filled 2022-11-14 (×4): qty 1

## 2022-11-14 SURGICAL SUPPLY — 37 items
ADH SKN CLS APL DERMABOND .7 (GAUZE/BANDAGES/DRESSINGS) ×1
APL PRP STRL LF DISP 70% ISPRP (MISCELLANEOUS) ×1
BAG COUNTER SPONGE SURGICOUNT (BAG) ×1 IMPLANT
BAG SPNG CNTER NS LX DISP (BAG) ×1
BLADE CLIPPER SURG (BLADE) IMPLANT
CANISTER SUCT 3000ML PPV (MISCELLANEOUS) IMPLANT
CHLORAPREP W/TINT 26 (MISCELLANEOUS) ×1 IMPLANT
COVER SURGICAL LIGHT HANDLE (MISCELLANEOUS) ×1 IMPLANT
DERMABOND ADVANCED .7 DNX12 (GAUZE/BANDAGES/DRESSINGS) IMPLANT
ELECT REM PT RETURN 9FT ADLT (ELECTROSURGICAL) ×1
ELECTRODE REM PT RTRN 9FT ADLT (ELECTROSURGICAL) ×1 IMPLANT
ENDOLOOP SUT PDS II 0 18 (SUTURE) IMPLANT
GLOVE BIOGEL PI IND STRL 7.5 (GLOVE) ×1 IMPLANT
GOWN STRL REUS W/ TWL LRG LVL3 (GOWN DISPOSABLE) ×2 IMPLANT
GOWN STRL REUS W/TWL LRG LVL3 (GOWN DISPOSABLE) ×2
GOWN STRL REUS W/TWL XL LVL3 (GOWN DISPOSABLE) ×1 IMPLANT
GRASPER SUT TROCAR 14GX15 (MISCELLANEOUS) IMPLANT
IRRIG SUCT STRYKERFLOW 2 WTIP (MISCELLANEOUS)
IRRIGATION SUCT STRKRFLW 2 WTP (MISCELLANEOUS) IMPLANT
KIT BASIN OR (CUSTOM PROCEDURE TRAY) ×1 IMPLANT
KIT TURNOVER KIT B (KITS) ×1 IMPLANT
NDL INSUFFLATION 14GA 120MM (NEEDLE) ×1 IMPLANT
NEEDLE INSUFFLATION 14GA 120MM (NEEDLE) ×1 IMPLANT
NS IRRIG 1000ML POUR BTL (IV SOLUTION) ×1 IMPLANT
PAD ARMBOARD 7.5X6 YLW CONV (MISCELLANEOUS) ×2 IMPLANT
SCISSORS LAP 5X35 DISP (ENDOMECHANICALS) IMPLANT
SET TUBE SMOKE EVAC HIGH FLOW (TUBING) ×1 IMPLANT
SLEEVE Z-THREAD 5X100MM (TROCAR) ×1 IMPLANT
SUT ETHILON 2 0 FS 18 (SUTURE) IMPLANT
SUT MNCRL AB 4-0 PS2 18 (SUTURE) ×1 IMPLANT
TOWEL GREEN STERILE (TOWEL DISPOSABLE) ×1 IMPLANT
TOWEL GREEN STERILE FF (TOWEL DISPOSABLE) ×1 IMPLANT
TRAY LAPAROSCOPIC MC (CUSTOM PROCEDURE TRAY) ×1 IMPLANT
TROCAR 11X100 Z THREAD (TROCAR) IMPLANT
TROCAR BALLN 12MMX100 BLUNT (TROCAR) IMPLANT
TROCAR Z-THREAD OPTICAL 5X100M (TROCAR) ×1 IMPLANT
WARMER LAPAROSCOPE (MISCELLANEOUS) ×1 IMPLANT

## 2022-11-14 NOTE — Anesthesia Preprocedure Evaluation (Addendum)
Anesthesia Evaluation  Patient identified by MRN, date of birth, ID band Patient awake    Reviewed: Allergy & Precautions, NPO status , Patient's Chart, lab work & pertinent test results  History of Anesthesia Complications Negative for: history of anesthetic complications  Airway Mallampati: II  TM Distance: >3 FB Neck ROM: Full    Dental  (+) Dental Advisory Given, Teeth Intact   Pulmonary neg pulmonary ROS   Pulmonary exam normal        Cardiovascular hypertension, Normal cardiovascular exam     Neuro/Psych  PSYCHIATRIC DISORDERS Anxiety     negative neurological ROS     GI/Hepatic negative GI ROS, Neg liver ROS,,,  Endo/Other  Hypothyroidism   K 3.2   Renal/GU negative Renal ROS     Musculoskeletal negative musculoskeletal ROS (+)    Abdominal   Peds  Hematology  (+) Blood dyscrasia, anemia   Anesthesia Other Findings   Reproductive/Obstetrics                             Anesthesia Physical Anesthesia Plan  ASA: 2  Anesthesia Plan: General   Post-op Pain Management: Ofirmev IV (intra-op)*, Toradol IV (intra-op)* and Ketamine IV*   Induction: Intravenous  PONV Risk Score and Plan: 3 and Treatment may vary due to age or medical condition, Ondansetron, Dexamethasone and Midazolam  Airway Management Planned: Oral ETT  Additional Equipment: None  Intra-op Plan:   Post-operative Plan: Extubation in OR  Informed Consent: I have reviewed the patients History and Physical, chart, labs and discussed the procedure including the risks, benefits and alternatives for the proposed anesthesia with the patient or authorized representative who has indicated his/her understanding and acceptance.     Dental advisory given  Plan Discussed with: CRNA and Anesthesiologist  Anesthesia Plan Comments:         Anesthesia Quick Evaluation

## 2022-11-14 NOTE — Progress Notes (Signed)
Subjective: Resting in bed, NAD.  Endorsing abdominal and shoulder pain as well as bloating.  She reports that she had pain immediately after surgery that failed to resolve.   ROS: See above, otherwise other systems negative  Objective: Vital signs in last 24 hours: Temp:  [97.4 F (36.3 C)-98.9 F (37.2 C)] 98.2 F (36.8 C) (10/10 0803) Pulse Rate:  [75-106] 75 (10/10 0803) Resp:  [14-32] 17 (10/10 0803) BP: (99-129)/(53-90) 102/55 (10/10 0803) SpO2:  [91 %-99 %] 97 % (10/10 0803) Weight:  [78 kg] 78 kg (10/09 1556) Last BM Date : 11/11/22  Intake/Output from previous day: 10/09 0701 - 10/10 0700 In: 1000 [IV Piggyback:1000] Out: -  Intake/Output this shift: No intake/output data recorded.  PE: Gen: female resting in bed, NAD Resp: equal chest rise CV: RRR Abd: soft, non-distended, mild TTP in the RUQ, port sites clean and dry with dermabond intact  Lab Results:  Recent Labs    11/13/22 1606 11/14/22 0728  WBC 14.3* 9.2  HGB 12.6 10.9*  HCT 37.1 33.4*  PLT 247 196   BMET Recent Labs    11/13/22 1606 11/14/22 0728  NA 137 139  K 3.4* 3.2*  CL 101 104  CO2 26 26  GLUCOSE 126* 100*  BUN 8 5*  CREATININE 0.78 0.84  CALCIUM 9.8 8.9   PT/INR No results for input(s): "LABPROT", "INR" in the last 72 hours. CMP     Component Value Date/Time   NA 139 11/14/2022 0728   K 3.2 (L) 11/14/2022 0728   CL 104 11/14/2022 0728   CO2 26 11/14/2022 0728   GLUCOSE 100 (H) 11/14/2022 0728   BUN 5 (L) 11/14/2022 0728   CREATININE 0.84 11/14/2022 0728   CALCIUM 8.9 11/14/2022 0728   PROT 6.2 (L) 11/14/2022 0728   ALBUMIN 3.4 (L) 11/14/2022 0728   AST 26 11/14/2022 0728   ALT 25 11/14/2022 0728   ALKPHOS 55 11/14/2022 0728   BILITOT 1.4 (H) 11/14/2022 0728   GFRNONAA >60 11/14/2022 0728   Lipase     Component Value Date/Time   LIPASE <10 (L) 11/13/2022 1606    Studies/Results: CT ABDOMEN PELVIS W CONTRAST  Result Date: 11/13/2022 CLINICAL DATA:   Postop abdomen pain, gallbladder surgery yesterday EXAM: CT ABDOMEN AND PELVIS WITH CONTRAST TECHNIQUE: Multidetector CT imaging of the abdomen and pelvis was performed using the standard protocol following bolus administration of intravenous contrast. RADIATION DOSE REDUCTION: This exam was performed according to the departmental dose-optimization program which includes automated exposure control, adjustment of the mA and/or kV according to patient size and/or use of iterative reconstruction technique. CONTRAST:  85mL OMNIPAQUE IOHEXOL 300 MG/ML  SOLN COMPARISON:  CT 10/10/2022, cholangiogram 11/12/2022 FINDINGS: Lower chest: Lung bases demonstrate no acute airspace disease. Hepatobiliary: Status post cholecystectomy. No biliary dilatation. Moderate perihepatic fluid, extending along the right gutter. Small amount of gas within the fluid collection anteriorly Pancreas: Unremarkable. No pancreatic ductal dilatation or surrounding inflammatory changes. Spleen: Normal in size without focal abnormality. Adrenals/Urinary Tract: Adrenal glands are unremarkable. Kidneys are normal, without renal calculi, focal lesion, or hydronephrosis. Bladder is unremarkable. Stomach/Bowel: Stomach is within normal limits. No evidence of bowel wall thickening, distention, or inflammatory changes. Vascular/Lymphatic: No significant vascular findings are present. No enlarged abdominal or pelvic lymph nodes. Reproductive: Uterus and bilateral adnexa are unremarkable. Other: Moderate volume fluid in the pelvis. Scattered foci of free air likely postoperative. Small foci of gas within soft tissues of the abdominal wall  consistent with recent surgery. Musculoskeletal: No acute osseous abnormality IMPRESSION: 1. Interval cholecystectomy. Moderate perihepatic free fluid and moderate free fluid in the pelvis, this is somewhat greater than would be expected for normal postoperative fluid and raises concern for potential biliary leak. Recommend  correlation with nuclear medicine hepatobiliary imaging. 2. Scattered foci of free intraperitoneal gas consistent with recent surgery. Electronically Signed   By: Jasmine Pang M.D.   On: 11/13/2022 17:35    Anti-infectives: Anti-infectives (From admission, onward)    None       Assessment/Plan 42 y/o F POD 2 from a lap chole w/ IOC presenting with abdominal pain with CT showing c/f bile leak  -  Will likely need to return to the OR for washout and control of any ongoing leak, possibly tomorrow.  Will follow up the result of the HIDA to determine next steps. - Continue supportive care for now   LOS: 0 days   Tacy Learn Surgery 11/14/2022, 11:27 AM Please see Amion for pager number during day hours 7:00am-4:30pm or 7:00am -11:30am on weekends

## 2022-11-14 NOTE — Progress Notes (Signed)
Patient and patient's mother state that the patient is having bright orange urine. Patient states that it hurts a little when she urinates but not much. Patient's mother is concerned that she may be urinating bile. Paged on call Sophronia Simas, MD from [facility] regarding the patients concerns, per patient request.

## 2022-11-14 NOTE — Plan of Care (Signed)
  Problem: Nutrition: Goal: Adequate nutrition will be maintained Outcome: Not Progressing   Problem: Elimination: Goal: Will not experience complications related to bowel motility Outcome: Not Progressing   Problem: Pain Managment: Goal: General experience of comfort will improve Outcome: Not Progressing   

## 2022-11-14 NOTE — Op Note (Signed)
Preoperative diagnosis: Bile leak  Postoperative diagnosis: Cystic duct stump leak  Procedure:  Diagnostic laparoscopy w/ intra-abdominal washout Endo-loop closure of the cystic duct Drain placement   Surgeon: Melody Haver, M.D. Co-Surgeon: Gaynelle Adu, MD Dr. Tawana Scale presence was essential to the completion of the case given that this was a re-operation that involved complex hepatobiliary anatomy.   Anesthesia: General  Indications for procedure: Annette Harris is a 42 y.o. year old female who underwent a laparoscopic cholecystectomy earlier in the week.  She experienced significant abdominal pain after discharge, prompting her to present to the hospital for evaluation.  Her labs and imaging were concerning for a bile leak.  I recommended that we proceed to the OR for  diagnostic laparoscopy, washout, and drain placement. All of her questions were addressed and written consent was obtained.   Description of procedure: The patient was brought into the operative suite. Anesthesia was administered with General endotracheal anesthesia. WHO checklist was applied. The patient was then placed in the supine position with a footboard in place. The area was prepped and draped in the usual sterile fashion.  The dermabond was removed from her incisions. The stitch at the supraumbilical incision was cut with scissors and the wound opened.  A 5-mm blunt trocar was inserted into the abdomen and the abdomen was insufflated to .  A laparoscope was inserted and there was no evidence of trauma related to entry.  Additional ports were placed through the previous incisions, an 11mm subxiphoid port and two 5-mm right subcostal ports.  We began with a thorough inspection of the right upper quadrant. There was a large amount of bile adjacent to the liver.  The liver was retracted upward, exposing the gallbladder fossa.  The clips that I had placed at the prior operation were identified. The clips on the  cystic artery were intact.  The clips on the cystic duct were also in place but appeared to not completely occlude the duct, resulting in a small amount of continuous leakage from the duct.  A 2-0 PDS endo-loop was brought into the abdomen and secured around the duct.  This appeared to control the leak.  The rest of the abdomen was inspected.  There was a large amount of bile in the pelvis and in the left upper quadrant. The entire abdomen was irrigated with 6 liters of sterile saline until the effluent ran clear. The cystic duct was inspected again and there was no sign of an ongoing bile leak.  A 19Fr drain was placed in the gallbladder fossa and brought out the lateral port.  The fascia at the subxiphoid port was closed using 0 vicryl on a suture passer.  The abdomen was desufflated.  The skin incisions were closed with 4-0 monocryl and dressed with dermabond.    The needle and instrument counts were reported as correct x 2.  The patient was awoken by anesthesia and taken to PACU.  Findings: Cystic duct leak due to incomplete occlusion from the previously placed clips. An endo-loop was placed over the duct and there was no evidence of ongoing bile leak  Specimen: None  Implant: 19Fr drain the gallbladder fossa   Blood loss: Minimal  Local anesthesia:  30 ml marcaine   Complications: None  Melody Haver, M.D. General Surgery [facility], [place]

## 2022-11-14 NOTE — Transfer of Care (Signed)
Immediate Anesthesia Transfer of Care Note  Patient: Annette Harris  Procedure(s) Performed: LAPAROSCOPY DIAGNOSTIC , DRAIN PLACEMENT AND WASHOUT  Patient Location: PACU  Anesthesia Type:General  Level of Consciousness: drowsy  Airway & Oxygen Therapy: Patient Spontanous Breathing  Post-op Assessment: Report given to RN and Post -op Vital signs reviewed and stable  Post vital signs: Reviewed and stable  Last Vitals:  Vitals Value Taken Time  BP 109/67 11/14/22 2215  Temp    Pulse 100 11/14/22 2221  Resp 14 11/14/22 2221  SpO2 94 % 11/14/22 2221  Vitals shown include unfiled device data.  Last Pain:  Vitals:   11/14/22 1858  TempSrc:   PainSc: 10-Worst pain ever      Patients Stated Pain Goal: 0 (11/14/22 0028)  Complications: No notable events documented.

## 2022-11-14 NOTE — ED Notes (Signed)
[facility] at bedside, pt. Given pain medication due to 8/10 pain to abdomen, and report given to [facility].

## 2022-11-14 NOTE — Progress Notes (Signed)
   11/14/22 1513  TOC Brief Assessment  Insurance and Status Reviewed  Patient has primary care physician Yes  Home environment has been reviewed yes  Prior level of function: independent  Prior/Current Home Services No current home services  Social Determinants of Health Reivew SDOH reviewed no interventions necessary  Readmission risk has been reviewed Yes  Transition of care needs no transition of care needs at this time   [facility] will continue to follow , may need surgery

## 2022-11-14 NOTE — Progress Notes (Signed)
Pt c/o of abdominal pain HIDA    P Adjust pain meds  Care explained to pt and family  Dr Hillery Hunter to see this PM once HIDA done

## 2022-11-15 ENCOUNTER — Encounter (HOSPITAL_COMMUNITY): Payer: Self-pay | Admitting: General Surgery

## 2022-11-15 LAB — COMPREHENSIVE METABOLIC PANEL
ALT: 38 U/L (ref 0–44)
AST: 36 U/L (ref 15–41)
Albumin: 3 g/dL — ABNORMAL LOW (ref 3.5–5.0)
Alkaline Phosphatase: 62 U/L (ref 38–126)
Anion gap: 9 (ref 5–15)
BUN: 7 mg/dL (ref 6–20)
CO2: 25 mmol/L (ref 22–32)
Calcium: 8.9 mg/dL (ref 8.9–10.3)
Chloride: 103 mmol/L (ref 98–111)
Creatinine, Ser: 0.93 mg/dL (ref 0.44–1.00)
GFR, Estimated: >60 mL/min (ref >60)
Glucose, Bld: 118 mg/dL — ABNORMAL HIGH (ref 70–99)
Potassium: 3.6 mmol/L (ref 3.5–5.1)
Sodium: 137 mmol/L (ref 135–145)
Total Bilirubin: 1 mg/dL (ref 0.3–1.2)
Total Protein: 5.9 g/dL — ABNORMAL LOW (ref 6.5–8.1)

## 2022-11-15 LAB — CBC
HCT: 31.1 % — ABNORMAL LOW (ref 36.0–46.0)
Hemoglobin: 10.3 g/dL — ABNORMAL LOW (ref 12.0–15.0)
MCH: 30.1 pg (ref 26.0–34.0)
MCHC: 33.1 g/dL (ref 30.0–36.0)
MCV: 90.9 fL (ref 80.0–100.0)
Platelets: 197 10*3/uL (ref 150–400)
RBC: 3.42 MIL/uL — ABNORMAL LOW (ref 3.87–5.11)
RDW: 12.5 % (ref 11.5–15.5)
WBC: 9.5 10*3/uL (ref 4.0–10.5)
nRBC: 0 % (ref 0.0–0.2)

## 2022-11-15 MED ORDER — METHOCARBAMOL 1000 MG/10ML IJ SOLN
1000.0000 mg | Freq: Three times a day (TID) | INTRAVENOUS | Status: DC
  Administered 2022-11-15 – 2022-11-17 (×4): 1000 mg via INTRAVENOUS
  Filled 2022-11-15 (×4): qty 10
  Filled 2022-11-15: qty 1000
  Filled 2022-11-15 (×2): qty 10
  Filled 2022-11-15: qty 1000

## 2022-11-15 MED ORDER — ALUM & MAG HYDROXIDE-SIMETH 200-200-20 MG/5ML PO SUSP
15.0000 mL | Freq: Four times a day (QID) | ORAL | Status: DC | PRN
  Administered 2022-11-15: 15 mL via ORAL
  Filled 2022-11-15: qty 30

## 2022-11-15 MED ORDER — LACTATED RINGERS IV BOLUS
1000.0000 mL | Freq: Once | INTRAVENOUS | Status: AC
  Administered 2022-11-15: 1000 mL via INTRAVENOUS

## 2022-11-15 NOTE — Plan of Care (Signed)

## 2022-11-15 NOTE — Progress Notes (Addendum)
1 Day Post-Op  Subjective: Her abdominal pain has improved significantly after the washout.  Tolerated clears last night.    ROS: See above, otherwise other systems negative  Objective: Vital signs in last 24 hours: Temp:  [97.9 F (36.6 C)-98.6 F (37 C)] 98.6 F (37 C) (10/11 0812) Pulse Rate:  [80-110] 86 (10/11 0812) Resp:  [14-20] 20 (10/11 0812) BP: (89-130)/(48-76) 116/70 (10/11 0812) SpO2:  [93 %-97 %] 95 % (10/11 0812) Last BM Date : 11/11/22  Intake/Output from previous day: 10/10 0701 - 10/11 0700 In: 836.5 [I.V.:400; IV Piggyback:436.5] Out: 600 [Urine:500; Drains:70; Blood:30] Intake/Output this shift: No intake/output data recorded.  PE: Gen: female resting in bed, NAD Resp: equal chest rise CV: RRR Abd: soft, non-distended, port sites clean and dry with dermabond intact, JP w/ serous fluid with bile tinge  Lab Results:  Recent Labs    11/13/22 1606 11/14/22 0728  WBC 14.3* 9.2  HGB 12.6 10.9*  HCT 37.1 33.4*  PLT 247 196   BMET Recent Labs    11/13/22 1606 11/14/22 0728  NA 137 139  K 3.4* 3.2*  CL 101 104  CO2 26 26  GLUCOSE 126* 100*  BUN 8 5*  CREATININE 0.78 0.84  CALCIUM 9.8 8.9   PT/INR No results for input(s): "LABPROT", "INR" in the last 72 hours. CMP     Component Value Date/Time   NA 139 11/14/2022 0728   K 3.2 (L) 11/14/2022 0728   CL 104 11/14/2022 0728   CO2 26 11/14/2022 0728   GLUCOSE 100 (H) 11/14/2022 0728   BUN 5 (L) 11/14/2022 0728   CREATININE 0.84 11/14/2022 0728   CALCIUM 8.9 11/14/2022 0728   PROT 6.2 (L) 11/14/2022 0728   ALBUMIN 3.4 (L) 11/14/2022 0728   AST 26 11/14/2022 0728   ALT 25 11/14/2022 0728   ALKPHOS 55 11/14/2022 0728   BILITOT 1.4 (H) 11/14/2022 0728   GFRNONAA >60 11/14/2022 0728   Lipase     Component Value Date/Time   LIPASE <10 (L) 11/13/2022 1606    Studies/Results: NM HEPATOBILIARY LEAK (POST-SURGICAL)  Result Date: 11/14/2022 CLINICAL DATA:  Evaluate for bladder  leak. Status post cholecystectomy with postoperative pain and increased perihepatic fluid. EXAM: NUCLEAR MEDICINE HEPATOBILIARY IMAGING TECHNIQUE: Sequential images of the abdomen were obtained out to 60 minutes following intravenous administration of radiopharmaceutical. RADIOPHARMACEUTICALS:  5.2 mCi Tc-62m  Choletec IV COMPARISON:  CT AP 11/13/2022 and intraoperative cholangiogram from 11/12/2022 FINDINGS: Prompt uptake and biliary excretion of activity by the liver is seen. There is early extravasation of the radiopharmaceutical from the gallbladder fossa. The extravasated radiopharmaceutical extends along the inferomedial margin of the right lobe of liver with progressive accumulation over the right hepatic lobe and along the right pericolic gutter. Common bile duct is felt to be patent with small bowel activity noted on delayed imaging. IMPRESSION: 1. Examination is positive for bile leak with active extravasation of the radiopharmaceutical from the gallbladder fossa. Electronically Signed   By: Signa Kell M.D.   On: 11/14/2022 15:06   CT ABDOMEN PELVIS W CONTRAST  Result Date: 11/13/2022 CLINICAL DATA:  Postop abdomen pain, gallbladder surgery yesterday EXAM: CT ABDOMEN AND PELVIS WITH CONTRAST TECHNIQUE: Multidetector CT imaging of the abdomen and pelvis was performed using the standard protocol following bolus administration of intravenous contrast. RADIATION DOSE REDUCTION: This exam was performed according to the departmental dose-optimization program which includes automated exposure control, adjustment of the mA and/or kV according to patient size and/or  use of iterative reconstruction technique. CONTRAST:  85mL OMNIPAQUE IOHEXOL 300 MG/ML  SOLN COMPARISON:  CT 10/10/2022, cholangiogram 11/12/2022 FINDINGS: Lower chest: Lung bases demonstrate no acute airspace disease. Hepatobiliary: Status post cholecystectomy. No biliary dilatation. Moderate perihepatic fluid, extending along the right gutter.  Small amount of gas within the fluid collection anteriorly Pancreas: Unremarkable. No pancreatic ductal dilatation or surrounding inflammatory changes. Spleen: Normal in size without focal abnormality. Adrenals/Urinary Tract: Adrenal glands are unremarkable. Kidneys are normal, without renal calculi, focal lesion, or hydronephrosis. Bladder is unremarkable. Stomach/Bowel: Stomach is within normal limits. No evidence of bowel wall thickening, distention, or inflammatory changes. Vascular/Lymphatic: No significant vascular findings are present. No enlarged abdominal or pelvic lymph nodes. Reproductive: Uterus and bilateral adnexa are unremarkable. Other: Moderate volume fluid in the pelvis. Scattered foci of free air likely postoperative. Small foci of gas within soft tissues of the abdominal wall consistent with recent surgery. Musculoskeletal: No acute osseous abnormality IMPRESSION: 1. Interval cholecystectomy. Moderate perihepatic free fluid and moderate free fluid in the pelvis, this is somewhat greater than would be expected for normal postoperative fluid and raises concern for potential biliary leak. Recommend correlation with nuclear medicine hepatobiliary imaging. 2. Scattered foci of free intraperitoneal gas consistent with recent surgery. Electronically Signed   By: Jasmine Pang M.D.   On: 11/13/2022 17:35    Anti-infectives: Anti-infectives (From admission, onward)    Start     Dose/Rate Route Frequency Ordered Stop   11/15/22 0800  ciprofloxacin (CIPRO) IVPB 400 mg        400 mg 200 mL/hr over 60 Minutes Intravenous Every 12 hours 11/14/22 2235 11/18/22 2359   11/15/22 0800  metroNIDAZOLE (FLAGYL) IVPB 500 mg        500 mg 100 mL/hr over 60 Minutes Intravenous Every 12 hours 11/14/22 2235 11/18/22 2359   11/14/22 2115  ciprofloxacin (CIPRO) IVPB 400 mg        400 mg 200 mL/hr over 60 Minutes Intravenous  Once 11/14/22 2108 11/14/22 2125   11/14/22 2115  metroNIDAZOLE (FLAGYL) IVPB 500 mg         500 mg 100 mL/hr over 60 Minutes Intravenous  Once 11/14/22 2108 11/14/22 2138       Assessment/Plan 42 y/o F s/p lap chole w/ IOC presenting with abdominal pain with CT showing c/f bile leak now s/p laparoscopic placement of endoloop suture over a leaking cystic duct on 10/10  - The fluid in her JP is mostly serous with a slight bilious tinge, which is not unexpected given how much bile had leaked prior to surgery.  I will plan to monitor the output over the next day or two to decide if GI needs to be involved - Continue abx for planned 4 days post-op - CLD for now - Replete K.  Follow up labs - Trend LFTs  Hypothyroidism (POA): Continue home synthroid   - CLD - Lovenox   LOS: 1 day   Tacy Learn Surgery 11/15/2022, 8:16 AM Please see Amion for pager number during day hours 7:00am-4:30pm or 7:00am -11:30am on weekends

## 2022-11-15 NOTE — Anesthesia Postprocedure Evaluation (Signed)
Anesthesia Post Note  Patient: Annette Harris  Procedure(s) Performed: LAPAROSCOPY DIAGNOSTIC , DRAIN PLACEMENT AND WASHOUT     Patient location during evaluation: PACU Anesthesia Type: General Level of consciousness: awake and alert Pain management: pain level controlled Vital Signs Assessment: post-procedure vital signs reviewed and stable Respiratory status: spontaneous breathing, nonlabored ventilation and respiratory function stable Cardiovascular status: stable and blood pressure returned to baseline Anesthetic complications: no   No notable events documented.  Last Vitals:  Vitals:   11/14/22 2300 11/14/22 2320  BP: 108/60 (!) 112/48  Pulse: 94 93  Resp: 16 18  Temp: 36.9 C 36.6 C  SpO2: 94% 96%                  Beryle Lathe

## 2022-11-16 LAB — CBC
HCT: 30.3 % — ABNORMAL LOW (ref 36.0–46.0)
Hemoglobin: 10 g/dL — ABNORMAL LOW (ref 12.0–15.0)
MCH: 30.2 pg (ref 26.0–34.0)
MCHC: 33 g/dL (ref 30.0–36.0)
MCV: 91.5 fL (ref 80.0–100.0)
Platelets: 224 10*3/uL (ref 150–400)
RBC: 3.31 MIL/uL — ABNORMAL LOW (ref 3.87–5.11)
RDW: 12.7 % (ref 11.5–15.5)
WBC: 6.9 10*3/uL (ref 4.0–10.5)
nRBC: 0 % (ref 0.0–0.2)

## 2022-11-16 LAB — COMPREHENSIVE METABOLIC PANEL
ALT: 27 U/L (ref 0–44)
AST: 23 U/L (ref 15–41)
Albumin: 2.9 g/dL — ABNORMAL LOW (ref 3.5–5.0)
Alkaline Phosphatase: 55 U/L (ref 38–126)
Anion gap: 14 (ref 5–15)
BUN: 7 mg/dL (ref 6–20)
CO2: 22 mmol/L (ref 22–32)
Calcium: 8.4 mg/dL — ABNORMAL LOW (ref 8.9–10.3)
Chloride: 103 mmol/L (ref 98–111)
Creatinine, Ser: 0.79 mg/dL (ref 0.44–1.00)
GFR, Estimated: >60 mL/min (ref >60)
Glucose, Bld: 93 mg/dL (ref 70–99)
Potassium: 3.6 mmol/L (ref 3.5–5.1)
Sodium: 139 mmol/L (ref 135–145)
Total Bilirubin: 0.9 mg/dL (ref 0.3–1.2)
Total Protein: 5.5 g/dL — ABNORMAL LOW (ref 6.5–8.1)

## 2022-11-16 MED ORDER — POLYETHYLENE GLYCOL 3350 17 G PO PACK
17.0000 g | PACK | Freq: Every day | ORAL | Status: DC
  Administered 2022-11-16: 17 g via ORAL
  Filled 2022-11-16 (×2): qty 1

## 2022-11-16 MED ORDER — PANTOPRAZOLE SODIUM 40 MG PO TBEC
40.0000 mg | DELAYED_RELEASE_TABLET | Freq: Every day | ORAL | Status: DC
  Administered 2022-11-16 – 2022-11-17 (×2): 40 mg via ORAL
  Filled 2022-11-16 (×2): qty 1

## 2022-11-16 NOTE — Progress Notes (Signed)
2 Days Post-Op  Subjective: Abdominal pain improved.  Tolerating solid diet.  Having some heartburn.  + flatus, no BM  ROS: See above, otherwise other systems negative  Objective: Vital signs in last 24 hours: Temp:  [98.1 F (36.7 C)-99.5 F (37.5 C)] 98.1 F (36.7 C) (10/12 0750) Pulse Rate:  [74-91] 91 (10/12 0750) Resp:  [18-22] 18 (10/12 0750) BP: (103-120)/(46-82) 120/82 (10/12 0750) SpO2:  [95 %-97 %] 97 % (10/12 0750) Last BM Date : 11/11/22  Intake/Output from previous day: 10/11 0701 - 10/12 0700 In: 820 [IV Piggyback:820] Out: 55 [Drains:55] Intake/Output this shift: No intake/output data recorded.  PE: Gen: female resting in bed, NAD Resp: equal chest rise CV: RRR Abd: soft, mild bloating, port sites clean and dry with dermabond intact, JP w/ serosang fluid.  No bile noted  Lab Results:  Recent Labs    11/15/22 1045 11/16/22 0221  WBC 9.5 6.9  HGB 10.3* 10.0*  HCT 31.1* 30.3*  PLT 197 224   BMET Recent Labs    11/15/22 1045 11/16/22 0221  NA 137 139  K 3.6 3.6  CL 103 103  CO2 25 22  GLUCOSE 118* 93  BUN 7 7  CREATININE 0.93 0.79  CALCIUM 8.9 8.4*   PT/INR No results for input(s): "LABPROT", "INR" in the last 72 hours. CMP     Component Value Date/Time   NA 139 11/16/2022 0221   K 3.6 11/16/2022 0221   CL 103 11/16/2022 0221   CO2 22 11/16/2022 0221   GLUCOSE 93 11/16/2022 0221   BUN 7 11/16/2022 0221   CREATININE 0.79 11/16/2022 0221   CALCIUM 8.4 (L) 11/16/2022 0221   PROT 5.5 (L) 11/16/2022 0221   ALBUMIN 2.9 (L) 11/16/2022 0221   AST 23 11/16/2022 0221   ALT 27 11/16/2022 0221   ALKPHOS 55 11/16/2022 0221   BILITOT 0.9 11/16/2022 0221   GFRNONAA >60 11/16/2022 0221   Lipase     Component Value Date/Time   LIPASE <10 (L) 11/13/2022 1606    Studies/Results: NM HEPATOBILIARY LEAK (POST-SURGICAL)  Result Date: 11/14/2022 CLINICAL DATA:  Evaluate for bladder leak. Status post cholecystectomy with postoperative pain  and increased perihepatic fluid. EXAM: NUCLEAR MEDICINE HEPATOBILIARY IMAGING TECHNIQUE: Sequential images of the abdomen were obtained out to 60 minutes following intravenous administration of radiopharmaceutical. RADIOPHARMACEUTICALS:  5.2 mCi Tc-56m  Choletec IV COMPARISON:  CT AP 11/13/2022 and intraoperative cholangiogram from 11/12/2022 FINDINGS: Prompt uptake and biliary excretion of activity by the liver is seen. There is early extravasation of the radiopharmaceutical from the gallbladder fossa. The extravasated radiopharmaceutical extends along the inferomedial margin of the right lobe of liver with progressive accumulation over the right hepatic lobe and along the right pericolic gutter. Common bile duct is felt to be patent with small bowel activity noted on delayed imaging. IMPRESSION: 1. Examination is positive for bile leak with active extravasation of the radiopharmaceutical from the gallbladder fossa. Electronically Signed   By: Signa Kell M.D.   On: 11/14/2022 15:06    Anti-infectives: Anti-infectives (From admission, onward)    Start     Dose/Rate Route Frequency Ordered Stop   11/15/22 0800  ciprofloxacin (CIPRO) IVPB 400 mg        400 mg 200 mL/hr over 60 Minutes Intravenous Every 12 hours 11/14/22 2235 11/18/22 2359   11/15/22 0800  metroNIDAZOLE (FLAGYL) IVPB 500 mg        500 mg 100 mL/hr over 60 Minutes Intravenous Every 12 hours  11/14/22 2235 11/18/22 2359   11/14/22 2115  ciprofloxacin (CIPRO) IVPB 400 mg        400 mg 200 mL/hr over 60 Minutes Intravenous  Once 11/14/22 2108 11/14/22 2125   11/14/22 2115  metroNIDAZOLE (FLAGYL) IVPB 500 mg        500 mg 100 mL/hr over 60 Minutes Intravenous  Once 11/14/22 2108 11/14/22 2138       Assessment/Plan 42 y/o F s/p lap chole w/ IOC presenting with abdominal pain with CT showing c/f bile leak now s/p laparoscopic placement of endoloop suture over a leaking cystic duct on 10/10  - The fluid in her JP is serosang today.   No evidence of bile.  Continue to monitor for another day.  If changes to bile, will need GI eval for ERCP/stenting possibly - Continue abx for planned 4 days post-op - regular diet - hypokalemia resolved - Trend LFTs - normal -add protonix for reflux - add miralax for bowel regimen  Hypothyroidism (POA): Continue home synthroid   - Lovenox - if continues to do well and drain remains nonbilious, will plan for DC likely tomorrow   LOS: 2 days   Jeri Lager Surgery 11/16/2022, 9:02 AM Please see Amion for pager number during day hours 7:00am-4:30pm or 7:00am -11:30am on weekends

## 2022-11-16 NOTE — Plan of Care (Signed)

## 2022-11-16 NOTE — Plan of Care (Signed)
Problem: Nutrition: Goal: Adequate nutrition will be maintained Outcome: Progressing   Problem: Pain Managment: Goal: General experience of comfort will improve Outcome: Progressing   Problem: Safety: Goal: Ability to remain free from injury will improve Outcome: Progressing

## 2022-11-17 LAB — COMPREHENSIVE METABOLIC PANEL
ALT: 52 U/L — ABNORMAL HIGH (ref 0–44)
AST: 37 U/L (ref 15–41)
Albumin: 3 g/dL — ABNORMAL LOW (ref 3.5–5.0)
Alkaline Phosphatase: 66 U/L (ref 38–126)
Anion gap: 7 (ref 5–15)
BUN: 6 mg/dL (ref 6–20)
CO2: 27 mmol/L (ref 22–32)
Calcium: 8.6 mg/dL — ABNORMAL LOW (ref 8.9–10.3)
Chloride: 103 mmol/L (ref 98–111)
Creatinine, Ser: 0.73 mg/dL (ref 0.44–1.00)
GFR, Estimated: >60 mL/min (ref >60)
Glucose, Bld: 109 mg/dL — ABNORMAL HIGH (ref 70–99)
Potassium: 3.3 mmol/L — ABNORMAL LOW (ref 3.5–5.1)
Sodium: 137 mmol/L (ref 135–145)
Total Bilirubin: 0.6 mg/dL (ref 0.3–1.2)
Total Protein: 5.6 g/dL — ABNORMAL LOW (ref 6.5–8.1)

## 2022-11-17 LAB — CBC
HCT: 29.7 % — ABNORMAL LOW (ref 36.0–46.0)
Hemoglobin: 9.9 g/dL — ABNORMAL LOW (ref 12.0–15.0)
MCH: 30.4 pg (ref 26.0–34.0)
MCHC: 33.3 g/dL (ref 30.0–36.0)
MCV: 91.1 fL (ref 80.0–100.0)
Platelets: 242 10*3/uL (ref 150–400)
RBC: 3.26 MIL/uL — ABNORMAL LOW (ref 3.87–5.11)
RDW: 12.6 % (ref 11.5–15.5)
WBC: 3.5 10*3/uL — ABNORMAL LOW (ref 4.0–10.5)
nRBC: 0 % (ref 0.0–0.2)

## 2022-11-17 MED ORDER — CIPROFLOXACIN HCL 500 MG PO TABS: 500.0000 mg | ORAL_TABLET | Freq: Two times a day (BID) | ORAL | 0 refills | Status: AC

## 2022-11-17 MED ORDER — METRONIDAZOLE 500 MG PO TABS: 500.0000 mg | ORAL_TABLET | Freq: Two times a day (BID) | ORAL | 0 refills | Status: AC

## 2022-11-17 NOTE — Discharge Summary (Signed)
Patient ID: Annette Harris [id number] 1980-07-19 42 y.o.  Admit date: 11/13/2022 Discharge date: 11/17/2022  Admitting Diagnosis: Post op bile leak  Discharge Diagnosis Patient Active Problem List   Diagnosis Date Noted   S/P laparoscopic cholecystectomy 11/14/2022   Postoperative bile leak 11/13/2022   Preeclampsia, severe, third trimester 01/05/2020   Microscopic colitis 01/15/2013   H/O Hashimoto thyroiditis 12/02/2012    Consultants none  Reason for Admission: Annette Harris is a 42 yo female who presented to the [facility] ED today with abdominal pain. She underwent a laparoscopic cholecystectomy yesterday by Dr. Hillery Hunter for symptomatic cholelithiasis. She had an intraoperative cholangiogram that was normal, and was discharged home postoperatively. She reports severe pain since surgery. Today she has had nausea, vomiting and PO intolerance. Labs in the ED were significant for a WBC of 14 and a Tbili of 1.4. A CT scan showed a moderate amount of perihepatic free fluid, as well as some free fluid in the pelvis. General surgery was consulted and she was directly admitted to [facility]. She continues to have severe abdominal pain, which she says is primarily in the central abdomen but radiates to the shoulders. She is not passing flatus or having bowel movements.   Procedures Dr. Hillery Hunter, 11/14/22 Diagnostic laparoscopy w/ intra-abdominal washout Endo-loop closure of the cystic duct Drain placement   Hospital Course:  The patient was admitted and underwent a HIDA to confirm bile leak.  She was taken to the OR for a dx lap with washout and endoloop placement on the cystic duct stump that was found to be leaking.  She was placed on Cipro/Flagyl post op and continued orally after discharge for a total of 4 days from time of surgery.  Her drain remained serous in nature with no evidence of bile.  Her LFTs were normal and WBC normal.  She was tolerating a diet and passing flatus with no  issues on day of discharge.  Pain was well controlled.  Physical Exam: Abd: soft, minimally tender, JP with serosang output, no bile, incisions c/d/i  Allergies as of 11/17/2022       Reactions   Penicillins Rash        Medication List     STOP taking these medications    PNV PO       TAKE these medications    acetaminophen 325 MG tablet Commonly known as: Tylenol Take 2 tablets (650 mg total) by mouth every 4 (four) hours as needed for up to 6 days.   ASHWAGANDHA PO Take 3,000 mg by mouth daily.   ciprofloxacin 500 MG tablet Commonly known as: Cipro Take 1 tablet (500 mg total) by mouth 2 (two) times daily for 2 days.   ferrous sulfate 325 (65 FE) MG EC tablet Take 325 mg by mouth 2 (two) times a week.   ibuprofen 200 MG tablet Commonly known as: Motrin IB Take 3 tablets (600 mg total) by mouth every 6 (six) hours as needed for up to 6 days.   levothyroxine 100 MCG tablet Commonly known as: SYNTHROID Take 100 mcg by mouth daily before breakfast.   metroNIDAZOLE 500 MG tablet Commonly known as: Flagyl Take 1 tablet (500 mg total) by mouth 2 (two) times daily for 2 days.   oxyCODONE 5 MG immediate release tablet Commonly known as: Oxy IR/ROXICODONE Take 1 tablet (5 mg total) by mouth every 6 (six) hours as needed for moderate pain.   Vitamin D 50 MCG (2000 UT) Caps Take 8,000 Units  by mouth daily.          Follow-up Information     Moise Boring, MD Follow up in 1 week(s).   Specialty: General Surgery Why: Office will call you with a follow up appointment, If you don't hear from the office, please call, Arrive 30 minutes prior to your appointment time, Please bring your insurance card and photo ID Contact information: [address] [place] [address] [place] [address] [phone]                 Signed: Barnetta Chapel, [facility] 11/17/2022, 9:34 AM Please see Amion for pager number during day hours  7:00am-4:30pm, 7-11:30am on Weekends

## 2022-11-17 NOTE — Discharge Instructions (Signed)

## 2022-11-17 NOTE — Plan of Care (Signed)
